# Patient Record
Sex: Female | Born: 1975 | Race: White | Hispanic: No | Marital: Married | Smoking: Never smoker
Health system: Southern US, Community
[De-identification: ages and names within clinical notes are randomized; demographics above are authoritative.]

## PROBLEM LIST (undated history)

## (undated) DIAGNOSIS — G43909 Migraine, unspecified, not intractable, without status migrainosus: Secondary | ICD-10-CM

## (undated) DIAGNOSIS — Z8742 Personal history of other diseases of the female genital tract: Secondary | ICD-10-CM

## (undated) HISTORY — DX: Personal history of other diseases of the female genital tract: Z87.42

## (undated) HISTORY — PX: AUGMENTATION MAMMAPLASTY: SUR837

## (undated) HISTORY — PX: TONSILLECTOMY: SHX5217

## (undated) HISTORY — DX: Migraine, unspecified, not intractable, without status migrainosus: G43.909

---

## 1998-11-22 ENCOUNTER — Other Ambulatory Visit: Admission: RE | Admit: 1998-11-22 | Discharge: 1998-11-22 | Payer: Self-pay | Admitting: *Deleted

## 1998-11-23 ENCOUNTER — Other Ambulatory Visit: Admission: RE | Admit: 1998-11-23 | Discharge: 1998-11-23 | Payer: Self-pay | Admitting: *Deleted

## 1999-03-29 ENCOUNTER — Other Ambulatory Visit: Admission: RE | Admit: 1999-03-29 | Discharge: 1999-03-29 | Payer: Self-pay | Admitting: *Deleted

## 1999-07-26 ENCOUNTER — Other Ambulatory Visit: Admission: RE | Admit: 1999-07-26 | Discharge: 1999-07-26 | Payer: Self-pay | Admitting: *Deleted

## 1999-11-21 ENCOUNTER — Other Ambulatory Visit: Admission: RE | Admit: 1999-11-21 | Discharge: 1999-11-21 | Payer: Self-pay | Admitting: *Deleted

## 2001-11-13 HISTORY — PX: BREAST BIOPSY: SHX20

## 2007-01-31 ENCOUNTER — Ambulatory Visit: Payer: Self-pay

## 2007-02-15 ENCOUNTER — Ambulatory Visit: Payer: Self-pay

## 2018-02-18 ENCOUNTER — Other Ambulatory Visit: Payer: Self-pay | Admitting: Certified Nurse Midwife

## 2018-02-18 DIAGNOSIS — Z1231 Encounter for screening mammogram for malignant neoplasm of breast: Secondary | ICD-10-CM

## 2018-03-07 ENCOUNTER — Other Ambulatory Visit: Payer: Self-pay | Admitting: Certified Nurse Midwife

## 2018-03-07 ENCOUNTER — Encounter: Payer: Self-pay | Admitting: Radiology

## 2018-03-07 ENCOUNTER — Ambulatory Visit
Admission: RE | Admit: 2018-03-07 | Discharge: 2018-03-07 | Disposition: A | Discharge status: Home / Self Care | Payer: Self-pay | Source: Ambulatory Visit | Attending: Certified Nurse Midwife | Admitting: Certified Nurse Midwife

## 2018-03-07 DIAGNOSIS — Z1231 Encounter for screening mammogram for malignant neoplasm of breast: Secondary | ICD-10-CM | POA: Insufficient documentation

## 2018-03-08 ENCOUNTER — Ambulatory Visit: Payer: Self-pay | Admitting: Internal Medicine

## 2018-03-08 ENCOUNTER — Encounter

## 2019-05-19 ENCOUNTER — Telehealth: Payer: Self-pay

## 2019-05-19 NOTE — Telephone Encounter (Signed)
TC from patient. IUD r/s 05/28/19 Aileen Fass, RN

## 2019-05-20 ENCOUNTER — Ambulatory Visit: Payer: Self-pay

## 2019-05-28 ENCOUNTER — Ambulatory Visit (LOCAL_COMMUNITY_HEALTH_CENTER): Payer: Self-pay

## 2019-05-28 ENCOUNTER — Other Ambulatory Visit: Payer: Self-pay

## 2019-05-28 VITALS — BP 111/75 | Ht 68.0 in | Wt 142.0 lb

## 2019-05-28 DIAGNOSIS — Z30430 Encounter for insertion of intrauterine contraceptive device: Secondary | ICD-10-CM

## 2019-05-28 DIAGNOSIS — Z3009 Encounter for other general counseling and advice on contraception: Secondary | ICD-10-CM

## 2019-05-28 MED ORDER — LEVONORGESTREL 20 MCG/24HR IU IUD
1.0000 | INTRAUTERINE_SYSTEM | Freq: Once | INTRAUTERINE | Status: AC
Start: 2019-05-28 — End: 2019-05-28
  Administered 2019-05-28: 1 via INTRAUTERINE

## 2019-05-28 NOTE — Progress Notes (Signed)
    Patient presented to [facility] for IUD insertion. Her GC/CT screening was declined by client d/t stated monogamous relationship and using WHO criteria we can be reasonably certain she is not pregnant.  See Flowsheet for IUD check list  IUD Insertion Procedure Note Patient identified, informed consent performed, consent signed.   Discussed risks of irregular bleeding, cramping, infection, malpositioning or misplacement of the IUD outside the uterus which may require further procedure such as laparoscopy. Time out was performed.  Urine pregnancy test negative.  Speculum placed in the vagina.  Cervix visualized.  Cleaned with Betadine x 2.  Cervix Grasped anteriorly with a single tooth tenaculum.  Uterus sounded to 8 cm.  IUD placed per manufacturer's recommendations.  Strings trimmed to 3 cm. Tenaculum was removed, good hemostasis noted.  Patient tolerated procedure well.   Patient was given post-procedure instructions.  She was advised to have backup contraception for one week.  Patient was also asked to check IUD strings periodically or follow up in 4 weeks for IUD check. Assessment/Plan Client declined GC/CT today citing that she and husband are monogamous. Co. To use condoms x 1 wk as back-up. Co. To take ibuprofen 6---800- mg q 6-8 hr prn Notify clinic for concerns or problems.

## 2019-09-25 ENCOUNTER — Other Ambulatory Visit: Payer: Self-pay

## 2019-09-25 ENCOUNTER — Ambulatory Visit (LOCAL_COMMUNITY_HEALTH_CENTER): Payer: Self-pay | Admitting: Advanced Practice Midwife

## 2019-09-25 VITALS — BP 128/72 | Ht 68.0 in | Wt 143.0 lb

## 2019-09-25 DIAGNOSIS — Z30432 Encounter for removal of intrauterine contraceptive device: Secondary | ICD-10-CM

## 2019-09-25 DIAGNOSIS — Z3009 Encounter for other general counseling and advice on contraception: Secondary | ICD-10-CM

## 2019-09-25 NOTE — Progress Notes (Signed)
Reports vaginal bleeding since insertion of Mirena. Husband is getting vasectomy. Last sex last night without condom. Aileen Fass, RN

## 2019-09-25 NOTE — Progress Notes (Signed)
   [facility] problem visit  [facility]  Subjective:  Cherril KAMARRI FISCHETTI is a 43 y.o.G2P2 nonsmoker MWF being seen today for Mirena removal  No chief complaint on file.   HPI Pt states Mirena inserted 05/28/19 and has had bleeding since then.  Last sex last night without condom.  Husband plans on vasectomy but no date as of yet; he has gone 3x for procedure but cancelled.  Last pap 08/2018 wnl per pt.  Counseled on normalcy of irregular bleeding/spotting for 3-5 months after insertion and the small chance she could conceive from unprotected coitus last night--pt insists on removal today, declines ECP today.  Does the patient have a current or past history of drug use? No   No components found for: HCV]   Health Maintenance Due  Topic Date Due  . HIV Screening  02/11/1989  . PAP SMEAR-Modifier  02/12/1995  . TETANUS/TDAP  05/27/2017  . INFLUENZA VACCINE  06/14/2019    ROS  The following portions of the patient's history were reviewed and updated as appropriate: allergies, current medications, past family history, past medical history, past social history, past surgical history and problem list. Problem list updated.   See flowsheet for other program required questions.  Objective:   Vitals:   09/25/19 1500  BP: 128/72  Weight: 143 lb (64.9 kg)  Height: 5\' 8"  (1.727 m)    Physical Exam Genitourinary:    General: Normal vulva.     Exam position: Lithotomy position.     Labia:        Right: No lesion.        Left: No lesion.      Vagina: Vaginal discharge (dark brown/red menses blood, ph>4.5, denies sxs vaginitis) present.     Cervix: Normal.     Uterus: Normal.      Adnexa: Right adnexa normal and left adnexa normal.     Rectum: Normal.       Assessment and Plan:  Mayo L Dyk is a 43 y.o. female presenting to the [facility] Department for a Women's Health problem visit  1. Family planning Pt counseled to do PT  10/09/19 and call if +  2. Encounter for removal of intrauterine contraceptive device (IUD)   IUD Removal  Patient identified, informed consent performed, consent signed.  Patient was in the dorsal lithotomy position, normal external genitalia was noted.  A speculum was placed in the patient's vagina, normal discharge was noted, no lesions. The cervix was visualized, no lesions, no abnormal discharge.  The strings of the IUD were grasped and pulled using ring forceps. The IUD was removed in its entirety.  Patient tolerated the procedure well.    Patient will use condoms for contraception.  Pt does not want pregnancy.  Routine preventative health maintenance measures emphasized.      Return if symptoms worsen or fail to improve.  No future appointments.  Herbie Saxon, CNM

## 2020-12-08 ENCOUNTER — Encounter: Payer: Self-pay | Admitting: Internal Medicine

## 2020-12-08 DIAGNOSIS — Z1231 Encounter for screening mammogram for malignant neoplasm of breast: Secondary | ICD-10-CM

## 2020-12-08 NOTE — Telephone Encounter (Signed)
If she is due screening and not having any problems, ok to place order.  Also, confirm we have records.  If not, will need to obtain prior to her appt.

## 2020-12-15 ENCOUNTER — Encounter: Payer: Self-pay | Admitting: Internal Medicine

## 2021-01-04 ENCOUNTER — Other Ambulatory Visit: Payer: Self-pay

## 2021-01-04 ENCOUNTER — Ambulatory Visit: Payer: Self-pay | Attending: Oncology

## 2021-01-04 ENCOUNTER — Ambulatory Visit
Admission: RE | Admit: 2021-01-04 | Discharge: 2021-01-04 | Disposition: A | Discharge status: Home / Self Care | Payer: Self-pay | Source: Ambulatory Visit | Attending: Oncology | Admitting: Oncology

## 2021-01-04 VITALS — BP 111/56 | HR 71 | Temp 98.6°F | Ht 69.0 in | Wt 142.7 lb

## 2021-01-04 DIAGNOSIS — Z0000 Encounter for general adult medical examination without abnormal findings: Secondary | ICD-10-CM | POA: Insufficient documentation

## 2021-01-04 NOTE — Progress Notes (Signed)
  Subjective:     [name], female   DOB: [birth year], 45 y.o.   MRN: [id number]  HPI   Review of Systems     Objective:   Physical Exam Chest:  Breasts:     Right: No swelling, bleeding, inverted nipple, mass, nipple discharge, skin change or tenderness.     Left: No swelling, bleeding, inverted nipple, mass, nipple discharge, skin change or tenderness.        Comments: Bilateral breast implants       Assessment:     45 year old patient presents for BCCCP clinic visitPatient screened, and meets BCCCP eligibility.  Patient does not have insurance, Medicare or Medicaid. Instructed patient on breast self awareness using teach back method.  Clinical breast exam unremarkable.  Patient has first and second degree relatives with breast and ovarian cancer.    Mother, and sister had BRCA negative testing in last 6 months.  Declines genetic counseling at this time.   Risk Assessment    Risk Scores      01/04/2021   Last edited by: Rico Junker, RN   5-year risk: 1.5 %   Lifetime risk: 13.4 %            Plan:     Sent for bilateral screening mammogram.

## 2021-01-10 NOTE — Progress Notes (Signed)
Letter mailed from [facility] to notify of normal mammogram results.  Patient to return in one year for annual screening.  Copy to [facility]. 

## 2021-02-21 ENCOUNTER — Telehealth: Payer: Self-pay

## 2021-02-21 ENCOUNTER — Encounter: Payer: Self-pay | Admitting: Internal Medicine

## 2021-02-21 ENCOUNTER — Ambulatory Visit (INDEPENDENT_AMBULATORY_CARE_PROVIDER_SITE_OTHER): Payer: Self-pay | Admitting: Internal Medicine

## 2021-02-21 ENCOUNTER — Other Ambulatory Visit: Payer: Self-pay

## 2021-02-21 DIAGNOSIS — G43809 Other migraine, not intractable, without status migrainosus: Secondary | ICD-10-CM

## 2021-02-21 DIAGNOSIS — Z1211 Encounter for screening for malignant neoplasm of colon: Secondary | ICD-10-CM

## 2021-02-21 DIAGNOSIS — Z8742 Personal history of other diseases of the female genital tract: Secondary | ICD-10-CM

## 2021-02-21 DIAGNOSIS — Z8041 Family history of malignant neoplasm of ovary: Secondary | ICD-10-CM

## 2021-02-21 MED ORDER — MAGNESIUM OXIDE 400 MG PO CAPS: ORAL_CAPSULE | ORAL | 1 refills | Status: DC

## 2021-02-21 NOTE — Telephone Encounter (Signed)
Labs printed for patient.

## 2021-02-21 NOTE — Progress Notes (Signed)
[name], female   DOB: [birth year], 45 y.o.   MRN: [id number]   Subjective:    [name], female    DOB: 1976-06-19, 45 y.o.   MRN: [id number]  HPI This visit occurred during the SARS-CoV-2 public health emergency.  Safety protocols were in place, including screening questions prior to the visit, additional usage of staff PPE, and extensive cleaning of exam room while observing appropriate contact time as indicated for disinfecting solutions.  Patient here to establish care.  Morene Rankins (mother).  She reports she is doing relatively well.  Has been healthy.  Works out 3-5x/week. No chest pain or sob reported.  No acid reflux or abdominal pain reported. Up to date with mammogram - 01/04/21.  Reports in 2019 - had abdominal and pelvic ultrasound.  History of ruptured ovarian cysts.  Bowels stable.  Has a history of migraines.  Is sensitive to light and smell.  States may have been aggravated by weather/pressure change.  Headaches - back of head and up and under left eye.  Drinks soda and takes excedrin.  Has a history of menstrual migraine.  Discussed trial of magnesium.  Had IUD 05/2019.  Out now. Intolerance.  Husband - vasectomy.  Periods relatively stable.  LMP 3/22 - some pain with period. None now.  Handling stress relatively well.  Takes hydroxyzine prn.    Past Medical History:  Diagnosis Date  . History of abnormal cervical Pap smear   . History of ovarian cyst   . Migraine    Past Surgical History:  Procedure Laterality Date  . AUGMENTATION MAMMAPLASTY Bilateral   . BREAST BIOPSY Left 2003  . TONSILLECTOMY     Family History  Problem Relation Age of Onset  . Breast cancer Maternal Grandmother 50  . Breast cancer Other 30  . Breast cancer Other   . Ovarian cancer Mother    Social History   Socioeconomic History  . Marital status: Married    Spouse name: Not on file  . Number of children: Not on file  . Years of education: Not on file  . Highest  education level: Not on file  Occupational History  . Not on file  Tobacco Use  . Smoking status: Never Smoker  . Smokeless tobacco: Never Used  Substance and Sexual Activity  . Alcohol use: Yes  . Drug use: Never  . Sexual activity: Yes  Other Topics Concern  . Not on file  Social History Narrative  . Not on file   Social Determinants of Health   Financial Resource Strain: Not on file  Food Insecurity: Not on file  Transportation Needs: Not on file  Physical Activity: Not on file  Stress: Not on file  Social Connections: Not on file    Outpatient Encounter Medications as of 02/21/2021  Medication Sig  . Magnesium Oxide 400 MG CAPS Take one capsule q day   No facility-administered encounter medications on file as of 02/21/2021.    Review of Systems  Constitutional: Negative for appetite change and unexpected weight change.  HENT: Negative for congestion and sinus pressure.   Respiratory: Negative for cough, chest tightness and shortness of breath.   Cardiovascular: Negative for chest pain, palpitations and leg swelling.  Gastrointestinal: Negative for abdominal pain, diarrhea, nausea and vomiting.  Genitourinary: Negative for difficulty urinating and dysuria.  Musculoskeletal: Negative for joint swelling and myalgias.  Skin: Negative for color change and rash.  Neurological: Positive for headaches. Negative for  dizziness and light-headedness.  Psychiatric/Behavioral: Negative for agitation and dysphoric mood.       Objective:    Physical Exam Vitals reviewed.  Constitutional:      General: She is not in acute distress.    Appearance: Normal appearance.  HENT:     Head: Normocephalic and atraumatic.     Right Ear: External ear normal.     Left Ear: External ear normal.  Eyes:     General: No scleral icterus.       Right eye: No discharge.        Left eye: No discharge.     Conjunctiva/sclera: Conjunctivae normal.  Neck:     Thyroid: No thyromegaly.   Cardiovascular:     Rate and Rhythm: Normal rate and regular rhythm.  Pulmonary:     Effort: No respiratory distress.     Breath sounds: Normal breath sounds. No wheezing.  Abdominal:     General: Bowel sounds are normal.     Palpations: Abdomen is soft.     Tenderness: There is no abdominal tenderness.  Musculoskeletal:        General: No swelling or tenderness.     Cervical back: Neck supple. No tenderness.  Lymphadenopathy:     Cervical: No cervical adenopathy.  Skin:    Findings: No erythema or rash.  Neurological:     Mental Status: She is alert.  Psychiatric:        Mood and Affect: Mood normal.        Behavior: Behavior normal.     BP 102/70   Pulse 80   Temp 98.3 F (36.8 C) (Oral)   Resp 16   Ht 5\' 9"  (1.753 m)   Wt 144 lb 9.6 oz (65.6 kg)   SpO2 99%   BMI 21.35 kg/m  Wt Readings from Last 3 Encounters:  02/21/21 144 lb 9.6 oz (65.6 kg)  01/04/21 142 lb 11.2 oz (64.7 kg)  09/25/19 143 lb (64.9 kg)    MS DIGITAL SCREENING TOMO W/IMPLANTS BILAT  Result Date: 01/07/2021 CLINICAL DATA:  Screening. Strong family history. EXAM: DIGITAL SCREENING BILATERAL MAMMOGRAM WITH IMPLANTS, CAD AND TOMOSYNTHESIS TECHNIQUE: Bilateral screening digital craniocaudal and mediolateral oblique mammograms were obtained. Bilateral screening digital breast tomosynthesis was performed. The images were evaluated with computer-aided detection. Standard and/or implant displaced views were performed. COMPARISON:  Previous exam(s). ACR Breast Density Category c: The breast tissue is heterogeneously dense, which may obscure small masses. FINDINGS: The patient has retropectoral saline implants. There are no findings suspicious for malignancy. Saline implants are intact. IMPRESSION: No mammographic evidence of malignancy. A result letter of this screening mammogram will be mailed directly to the patient. RECOMMENDATION: Screening mammogram in one year. (Code:SM-B-01Y) The American Cancer Society  recommends annual MRI and mammography in patients with an estimated lifetime risk of developing breast cancer greater than 20 - 25%, or who are known or suspected to be positive for the breast cancer gene. BI-RADS CATEGORY  1:  Negative. Electronically Signed   By: 01/09/2021 MD   On: 01/07/2021 13:10       Assessment & Plan:   Problem List Items Addressed This Visit    Colon cancer screening    Colonoscopy - recommended age 66 now.        Family history of ovarian cancer    Mother with history of ovarian cancer - undergoing treatment.  Follow.  Review family history.        History of abnormal cervical  Pap smear    Noted in history.  Recent pap ok.  Schedule for cpe.       History of ovarian cyst    Previous evaluation with ultrasound.  No acute problems currently.        Migraines    History of migraine headaches as outlined.  Discussed trial of mag oxide.  Follow.            Dale Barahona, MD

## 2021-02-23 LAB — COMPREHENSIVE METABOLIC PANEL
Albumin: 4.3 (ref 3.5–5.0)
GFR calc Af Amer: 121
GFR calc non Af Amer: 105
Globulin: 2

## 2021-02-23 LAB — LIPID PANEL
Cholesterol: 176 (ref 0–200)
HDL: 78 — AB (ref 35–70)
LDL Cholesterol: 85
Triglycerides: 58 (ref 40–160)

## 2021-02-23 LAB — BASIC METABOLIC PANEL
BUN: 14 (ref 4–21)
CO2: 24 — AB (ref 13–22)
Chloride: 105 (ref 99–108)
Creatinine: 0.7 (ref 0.5–1.1)
Glucose: 99
Potassium: 4.7 (ref 3.4–5.3)
Sodium: 139 (ref 137–147)

## 2021-02-23 LAB — HEPATIC FUNCTION PANEL
ALT: 13 (ref 7–35)
AST: 15 (ref 13–35)
Alkaline Phosphatase: 50 (ref 25–125)
Bilirubin, Total: 0.8

## 2021-02-23 LAB — CBC AND DIFFERENTIAL
HCT: 41 (ref 36–46)
Hemoglobin: 13.4 (ref 12.0–16.0)
Neutrophils Absolute: 4349
Platelets: 213 (ref 150–399)
WBC: 6.5

## 2021-02-23 LAB — HEMOGLOBIN A1C: Hemoglobin A1C: 4.8

## 2021-02-23 LAB — CBC: RBC: 4.33 (ref 3.87–5.11)

## 2021-02-23 LAB — TSH: TSH: 0.87 (ref 0.41–5.90)

## 2021-02-28 ENCOUNTER — Encounter: Payer: Self-pay | Admitting: Internal Medicine

## 2021-02-28 DIAGNOSIS — Z8742 Personal history of other diseases of the female genital tract: Secondary | ICD-10-CM | POA: Insufficient documentation

## 2021-02-28 DIAGNOSIS — G43909 Migraine, unspecified, not intractable, without status migrainosus: Secondary | ICD-10-CM | POA: Insufficient documentation

## 2021-02-28 DIAGNOSIS — Z8041 Family history of malignant neoplasm of ovary: Secondary | ICD-10-CM | POA: Insufficient documentation

## 2021-02-28 DIAGNOSIS — Z1211 Encounter for screening for malignant neoplasm of colon: Secondary | ICD-10-CM | POA: Insufficient documentation

## 2021-02-28 NOTE — Assessment & Plan Note (Signed)
Noted in history.  Recent pap ok.  Schedule for cpe.

## 2021-02-28 NOTE — Assessment & Plan Note (Signed)
Mother with history of ovarian cancer - undergoing treatment.  Follow.  Review family history.

## 2021-02-28 NOTE — Assessment & Plan Note (Signed)
Colonoscopy - recommended age 45 now.

## 2021-02-28 NOTE — Assessment & Plan Note (Signed)
History of migraine headaches as outlined.  Discussed trial of mag oxide.  Follow.

## 2021-02-28 NOTE — Assessment & Plan Note (Signed)
Previous evaluation with ultrasound.  No acute problems currently.

## 2021-03-21 ENCOUNTER — Telehealth: Payer: Self-pay | Admitting: Internal Medicine

## 2021-03-21 MED ORDER — MAGNESIUM OXIDE 400 MG PO TABS: 400.0000 mg | ORAL_TABLET | Freq: Every day | ORAL | 1 refills | Status: DC

## 2021-03-21 NOTE — Telephone Encounter (Signed)
I can send in mag oxide tablets, but need to know what pharmacy she uses locally. The only pharmacy in the system is [facility].  Thanks

## 2021-03-21 NOTE — Telephone Encounter (Signed)
Patient called in about her prescription for Magnesium Oxide 400 MG CAPS was sent to pharmacy they did not have the capsule need a new prescription for tablets

## 2021-03-21 NOTE — Telephone Encounter (Signed)
Mistake sorry

## 2021-03-21 NOTE — Telephone Encounter (Signed)
Patient uses [facility] in [place]. Medication sent in to preferred pharmacy per protocol.

## 2021-05-24 ENCOUNTER — Encounter: Payer: Self-pay | Admitting: Internal Medicine

## 2021-06-21 ENCOUNTER — Telehealth: Payer: Self-pay | Admitting: Internal Medicine

## 2021-06-21 NOTE — Telephone Encounter (Signed)
Pt said at her last appt that hydroxyzine hcl 25 mg was discussed and she would like it sent to [facility] in [place]

## 2021-06-21 NOTE — Telephone Encounter (Signed)
Per your last note in Sheri Leonard she uses hydroxyzine 25 mg prn. This has not been prescribed by you but she has only seen you one time to establish care. Are you ok to send in prescription for her?

## 2021-06-22 MED ORDER — HYDROXYZINE PAMOATE 25 MG PO CAPS: 25.0000 mg | ORAL_CAPSULE | Freq: Two times a day (BID) | ORAL | 0 refills | Status: DC | PRN

## 2021-06-22 NOTE — Addendum Note (Signed)
Addended by: Charm Barges on: 06/22/2021 02:56 PM   Modules accepted: Orders

## 2021-06-22 NOTE — Telephone Encounter (Signed)
Patient says it was originally prescribed for 1 tab TID PRN but patient 1-2 PRN and does not take 2 tablets at the same time. She has been using about 3 tablets per week for the last few weeks.

## 2021-06-22 NOTE — Telephone Encounter (Signed)
I do not mind prescribing hydroxyzine.  Per our conversation, she takes prn.   Just need to clarify a few things.  Let her know that I do not mind refilling, but just need to confirm how often she takes.

## 2021-06-22 NOTE — Telephone Encounter (Signed)
Rx sent to pharmacy for hydroxyzine #30 with no refills.

## 2021-07-19 ENCOUNTER — Other Ambulatory Visit: Payer: Self-pay

## 2021-07-19 ENCOUNTER — Encounter: Payer: Self-pay | Admitting: Internal Medicine

## 2021-07-19 ENCOUNTER — Ambulatory Visit (INDEPENDENT_AMBULATORY_CARE_PROVIDER_SITE_OTHER): Payer: Self-pay | Admitting: Internal Medicine

## 2021-07-19 VITALS — BP 118/76 | HR 67 | Temp 97.9°F | Resp 16 | Ht 69.0 in | Wt 142.0 lb

## 2021-07-19 DIAGNOSIS — G43809 Other migraine, not intractable, without status migrainosus: Secondary | ICD-10-CM

## 2021-07-19 DIAGNOSIS — Z8041 Family history of malignant neoplasm of ovary: Secondary | ICD-10-CM

## 2021-07-19 DIAGNOSIS — Z1211 Encounter for screening for malignant neoplasm of colon: Secondary | ICD-10-CM

## 2021-07-19 DIAGNOSIS — Z8742 Personal history of other diseases of the female genital tract: Secondary | ICD-10-CM

## 2021-07-19 DIAGNOSIS — Z0000 Encounter for general adult medical examination without abnormal findings: Secondary | ICD-10-CM

## 2021-07-19 DIAGNOSIS — Z124 Encounter for screening for malignant neoplasm of cervix: Secondary | ICD-10-CM

## 2021-07-19 DIAGNOSIS — N920 Excessive and frequent menstruation with regular cycle: Secondary | ICD-10-CM

## 2021-07-19 NOTE — Progress Notes (Signed)
[name], female   DOB: 16-Nov-1975, 45 y.o.   MRN: [id number]   Subjective:    [name], female    DOB: 1975-12-21, 45 y.o.   MRN: [id number]  This visit occurred during the SARS-CoV-2 public health emergency.  Safety protocols were in place, including screening questions prior to the visit, additional usage of staff PPE, and extensive cleaning of exam room while observing appropriate contact time as indicated for disinfecting solutions.   Patient here for her physical.   Chief Complaint  Patient presents with   Annual Exam   .   HPI States she is doing relatively well.  Stress better.  Is exercising.  Is frustrated about her weight.  She is exercising 3-5x/week.  Going to the gym.  Watching her diet.  Wants something to help.  Discussed BMI and weight loss medications.  No chest pain or sob reported.  Still having headaches.  Magnesium did not help.  Discussed treatment options.  No acid reflux reported.  No abdominal pain.  No history of kidney stones.  Bowels stable.  Having heavy periods - for 2-3 days - periods heavy.  Period lasts 5-6 days. (q 25 days).  Also has a history of ovarian cysts.  States had pain last month, that was similar.  No pain now, but states will flare intermittently.    Past Medical History:  Diagnosis Date   History of abnormal cervical Pap smear    History of ovarian cyst    Migraine    Past Surgical History:  Procedure Laterality Date   AUGMENTATION MAMMAPLASTY Bilateral    BREAST BIOPSY Left 2003   TONSILLECTOMY     Family History  Problem Relation Age of Onset   Breast cancer Maternal Grandmother 61   Breast cancer Other 30   Breast cancer Other    Ovarian cancer Mother    Social History   Socioeconomic History   Marital status: Married    Spouse name: Not on file   Number of children: Not on file   Years of education: Not on file   Highest education level: Not on file  Occupational History   Not on file   Tobacco Use   Smoking status: Never   Smokeless tobacco: Never  Substance and Sexual Activity   Alcohol use: Yes   Drug use: Never   Sexual activity: Yes  Other Topics Concern   Not on file  Social History Narrative   Not on file   Social Determinants of Health   Financial Resource Strain: Not on file  Food Insecurity: Not on file  Transportation Needs: Not on file  Physical Activity: Not on file  Stress: Not on file  Social Connections: Not on file     Review of Systems  Constitutional:  Negative for appetite change and unexpected weight change.  HENT:  Negative for congestion, sinus pressure and sore throat.   Eyes:  Negative for pain and visual disturbance.  Respiratory:  Negative for cough, chest tightness and shortness of breath.   Cardiovascular:  Negative for chest pain and palpitations.  Gastrointestinal:  Negative for diarrhea, nausea and vomiting.       Intermittent lower abdominal/pelvic pain - as outlined.  History of cysts.   Genitourinary:  Negative for difficulty urinating and dysuria.       Heavy menstrual cycle as outlined.    Musculoskeletal:  Negative for joint swelling and myalgias.  Skin:  Negative for color change and  rash.  Neurological:  Positive for headaches. Negative for dizziness and light-headedness.  Hematological:  Negative for adenopathy. Does not bruise/bleed easily.  Psychiatric/Behavioral:  Negative for agitation and dysphoric mood.       Objective:     BP 118/76   Pulse 67   Temp 97.9 F (36.6 C)   Resp 16   Ht 5\' 9"  (1.753 m)   Wt 142 lb (64.4 kg)   SpO2 98%   BMI 20.97 kg/m  Wt Readings from Last 3 Encounters:  07/19/21 142 lb (64.4 kg)  02/21/21 144 lb 9.6 oz (65.6 kg)  01/04/21 142 lb 11.2 oz (64.7 kg)    Physical Exam Vitals reviewed.  Constitutional:      General: She is not in acute distress.    Appearance: Normal appearance. She is well-developed.  HENT:     Head: Normocephalic and atraumatic.     Right  Ear: External ear normal.     Left Ear: External ear normal.  Eyes:     General: No scleral icterus.       Right eye: No discharge.        Left eye: No discharge.     Conjunctiva/sclera: Conjunctivae normal.  Neck:     Thyroid: No thyromegaly.  Cardiovascular:     Rate and Rhythm: Normal rate and regular rhythm.  Pulmonary:     Effort: No tachypnea, accessory muscle usage or respiratory distress.     Breath sounds: No decreased breath sounds or wheezing.     Comments: Breast implants.  Chest:  Breasts:    Right: No inverted nipple, mass, nipple discharge or tenderness (no axillary adenopathy).     Left: No inverted nipple, mass, nipple discharge or tenderness (no axilarry adenopathy).  Abdominal:     General: Bowel sounds are normal.     Palpations: Abdomen is soft.     Tenderness: There is no abdominal tenderness.  Genitourinary:    Comments: Attempted pap.  Increased blood in vaginal vault.  Unable to do pap.  Normal external genitalia.  Vaginal vault visualized without lesions.  Could not appreciate any adnexal masses or tenderness.   Musculoskeletal:        General: No swelling or tenderness.     Cervical back: Neck supple.  Lymphadenopathy:     Cervical: No cervical adenopathy.  Skin:    Findings: No erythema or rash.  Neurological:     Mental Status: She is alert and oriented to person, place, and time.  Psychiatric:        Mood and Affect: Mood normal.        Behavior: Behavior normal.     Outpatient Encounter Medications as of 07/19/2021  Medication Sig   hydrOXYzine (VISTARIL) 25 MG capsule Take 1 capsule (25 mg total) by mouth 2 (two) times daily as needed.   magnesium oxide (MAG-OX) 400 MG tablet Take 1 tablet (400 mg total) by mouth daily.   No facility-administered encounter medications on file as of 07/19/2021.     MS DIGITAL SCREENING TOMO W/IMPLANTS BILAT  Result Date: 01/07/2021 CLINICAL DATA:  Screening. Strong family history. EXAM: DIGITAL SCREENING  BILATERAL MAMMOGRAM WITH IMPLANTS, CAD AND TOMOSYNTHESIS TECHNIQUE: Bilateral screening digital craniocaudal and mediolateral oblique mammograms were obtained. Bilateral screening digital breast tomosynthesis was performed. The images were evaluated with computer-aided detection. Standard and/or implant displaced views were performed. COMPARISON:  Previous exam(s). ACR Breast Density Category c: The breast tissue is heterogeneously dense, which may obscure small masses. FINDINGS: The  patient has retropectoral saline implants. There are no findings suspicious for malignancy. Saline implants are intact. IMPRESSION: No mammographic evidence of malignancy. A result letter of this screening mammogram will be mailed directly to the patient. RECOMMENDATION: Screening mammogram in one year. (Code:SM-B-01Y) The American Cancer Society recommends annual MRI and mammography in patients with an estimated lifetime risk of developing breast cancer greater than 20 - 25%, or who are known or suspected to be positive for the breast cancer gene. BI-RADS CATEGORY  1:  Negative. Electronically Signed   By: Meda Klinefelter MD   On: 01/07/2021 13:10       Assessment & Plan:   Problem List Items Addressed This Visit     Colon cancer screening    Colonoscopy due - screening starting 43.        Family history of ovarian cancer    Mother with ovarian cancer.  Obtain pelvic ultrasound as outlined given history of cysts and intermittent pain.        Relevant Orders   US Pelvic Complete With Transvaginal   Healthcare maintenance    Physical today 07/19/21.  Unable to do pap today as outlined.  Schedule for next visit.  Mammogram 01/04/21 - [facility].  Due colonoscopy screening.       Heavy periods    Heavy periods as outlined.  Check pelvic ultrasound.  Regular periods.  Keep menstrual diary.       History of abnormal cervical Pap smear    Noted in history.  Last pap - ok.  Unable to do f/u pap today secondary to her  having her period.  Return next visit for pap smear.       History of ovarian cyst    With persistent intermittent lower pelvic pain that she relates to her history of ovarian cysts (and family history), will obtain pelvic ultrasound.        Relevant Orders   US Pelvic Complete With Transvaginal   Migraines    Persistent problems with migraines.  Magnesium did not help.  Discussed other treatment options.  Consider trial of topamax.  No history of kidney stones.  Discussed possible side effect of weight loss.  Follow.       Other Visit Diagnoses     Routine general medical examination at a health care facility    -  Primary   Cervical cancer screening            Dale Rutherford, MD

## 2021-07-20 ENCOUNTER — Telehealth: Payer: Self-pay | Admitting: Internal Medicine

## 2021-07-20 ENCOUNTER — Encounter: Payer: Self-pay | Admitting: Internal Medicine

## 2021-07-20 NOTE — Telephone Encounter (Signed)
Reviewed labs.  Cholesterol looks good.  Sugar, hgb, thyroid wnl.  Need to abstract labs.

## 2021-07-20 NOTE — Telephone Encounter (Signed)
error 

## 2021-07-23 MED ORDER — TOPIRAMATE 25 MG PO TABS
25.0000 mg | ORAL_TABLET | Freq: Every day | ORAL | 2 refills | Status: DC
Start: 2021-07-23 — End: 2022-03-10

## 2021-07-23 NOTE — Telephone Encounter (Signed)
Rx sent in for topamax.  Someone will call her with pelvic ultrasound appt.

## 2021-07-24 ENCOUNTER — Encounter: Payer: Self-pay | Admitting: Internal Medicine

## 2021-07-24 DIAGNOSIS — Z0000 Encounter for general adult medical examination without abnormal findings: Secondary | ICD-10-CM | POA: Insufficient documentation

## 2021-07-24 DIAGNOSIS — N920 Excessive and frequent menstruation with regular cycle: Secondary | ICD-10-CM | POA: Insufficient documentation

## 2021-07-24 NOTE — Assessment & Plan Note (Signed)
Persistent problems with migraines.  Magnesium did not help.  Discussed other treatment options.  Consider trial of topamax.  No history of kidney stones.  Discussed possible side effect of weight loss.  Follow.

## 2021-07-24 NOTE — Assessment & Plan Note (Signed)
With persistent intermittent lower pelvic pain that she relates to her history of ovarian cysts (and family history), will obtain pelvic ultrasound.

## 2021-07-24 NOTE — Assessment & Plan Note (Signed)
Physical today 07/19/21.  Unable to do pap today as outlined.  Schedule for next visit.  Mammogram 01/04/21 - Birads I.  Due colonoscopy screening.

## 2021-07-24 NOTE — Assessment & Plan Note (Signed)
Heavy periods as outlined.  Check pelvic ultrasound.  Regular periods.  Keep menstrual diary.

## 2021-07-24 NOTE — Assessment & Plan Note (Signed)
Noted in history.  Last pap - ok.  Unable to do f/u pap today secondary to her having her period.  Return next visit for pap smear.

## 2021-07-24 NOTE — Assessment & Plan Note (Signed)
Mother with ovarian cancer.  Obtain pelvic ultrasound as outlined given history of cysts and intermittent pain.

## 2021-07-24 NOTE — Assessment & Plan Note (Signed)
Colonoscopy due - screening starting 45.

## 2021-08-03 ENCOUNTER — Ambulatory Visit
Admission: RE | Admit: 2021-08-03 | Discharge: 2021-08-03 | Disposition: A | Discharge status: Home / Self Care | Payer: Self-pay | Source: Ambulatory Visit | Attending: Internal Medicine | Admitting: Internal Medicine

## 2021-08-03 ENCOUNTER — Other Ambulatory Visit: Payer: Self-pay

## 2021-08-03 DIAGNOSIS — Z8742 Personal history of other diseases of the female genital tract: Secondary | ICD-10-CM | POA: Insufficient documentation

## 2021-08-03 DIAGNOSIS — Z8041 Family history of malignant neoplasm of ovary: Secondary | ICD-10-CM | POA: Insufficient documentation

## 2021-08-04 ENCOUNTER — Other Ambulatory Visit: Payer: Self-pay | Admitting: Internal Medicine

## 2021-08-04 ENCOUNTER — Telehealth: Payer: Self-pay | Admitting: *Deleted

## 2021-08-04 DIAGNOSIS — Z8041 Family history of malignant neoplasm of ovary: Secondary | ICD-10-CM

## 2021-08-04 DIAGNOSIS — N83299 Other ovarian cyst, unspecified side: Secondary | ICD-10-CM

## 2021-08-04 NOTE — Progress Notes (Signed)
Order placed for gyn referral.  

## 2021-08-04 NOTE — Telephone Encounter (Signed)
-----   Message from Dale Anthony, MD sent at 08/04/2021  4:22 AM EDT ----- Tried to call pt.  Left message on machine to call the office.  Please notify her that her pelvic ultrasound reveals what appears to be a left ovarian cyst.  Radiology had recommended f/u ultrasound in 6-12 weeks.  I would like to refer her to gyn for further evaluation and f/u ultrasound.  If agreeable, need to know which gyn she prefers to see.

## 2021-08-04 NOTE — Telephone Encounter (Signed)
Left message to call office

## 2021-08-08 NOTE — Telephone Encounter (Signed)
See Imaging note.

## 2021-08-30 ENCOUNTER — Other Ambulatory Visit (HOSPITAL_COMMUNITY)
Admission: RE | Admit: 2021-08-30 | Discharge: 2021-08-30 | Disposition: A | Discharge status: Home / Self Care | Payer: Self-pay | Source: Ambulatory Visit | Attending: Internal Medicine | Admitting: Internal Medicine

## 2021-08-30 ENCOUNTER — Ambulatory Visit (INDEPENDENT_AMBULATORY_CARE_PROVIDER_SITE_OTHER): Payer: Self-pay | Admitting: Internal Medicine

## 2021-08-30 ENCOUNTER — Encounter: Payer: Self-pay | Admitting: Internal Medicine

## 2021-08-30 ENCOUNTER — Other Ambulatory Visit: Payer: Self-pay

## 2021-08-30 DIAGNOSIS — Z8041 Family history of malignant neoplasm of ovary: Secondary | ICD-10-CM

## 2021-08-30 DIAGNOSIS — Z124 Encounter for screening for malignant neoplasm of cervix: Secondary | ICD-10-CM

## 2021-08-30 DIAGNOSIS — Z1211 Encounter for screening for malignant neoplasm of colon: Secondary | ICD-10-CM

## 2021-08-30 DIAGNOSIS — G43809 Other migraine, not intractable, without status migrainosus: Secondary | ICD-10-CM

## 2021-08-30 DIAGNOSIS — Z8742 Personal history of other diseases of the female genital tract: Secondary | ICD-10-CM

## 2021-08-30 NOTE — Progress Notes (Signed)
[name], female   DOB: 03-23-1976, 45 y.o.   MRN: [id number]   Subjective:    [name], female    DOB: 1976/01/09, 45 y.o.   MRN: [id number]  This visit occurred during the SARS-CoV-2 public health emergency.  Safety protocols were in place, including screening questions prior to the visit, additional usage of staff PPE, and extensive cleaning of exam room while observing appropriate contact time as indicated for disinfecting solutions.   Patient here for a scheduled follow up.  Chief Complaint  Patient presents with   Follow-up   Gynecologic Exam   .   HPI Here to follow up regarding her headaches.  Also due a pap smear.  Started on topamax last visit.  Headaches are better.  Stays active.  No chest pain or sob reported.  No abdominal pain.  Bowels moving.  Discussed due colonoscopy.  Also, has noticed - intercourse is painful.  Had recent ultrasound - revealed complicated cyst - left ovary.  Discussed gyn f/u.     Past Medical History:  Diagnosis Date   History of abnormal cervical Pap smear    History of ovarian cyst    Migraine    Past Surgical History:  Procedure Laterality Date   AUGMENTATION MAMMAPLASTY Bilateral    BREAST BIOPSY Left 2003   TONSILLECTOMY     Family History  Problem Relation Age of Onset   Breast cancer Maternal Grandmother 34   Breast cancer Other 30   Breast cancer Other    Ovarian cancer Mother    Social History   Socioeconomic History   Marital status: Married    Spouse name: Not on file   Number of children: Not on file   Years of education: Not on file   Highest education level: Not on file  Occupational History   Not on file  Tobacco Use   Smoking status: Never   Smokeless tobacco: Never  Substance and Sexual Activity   Alcohol use: Yes   Drug use: Never   Sexual activity: Yes  Other Topics Concern   Not on file  Social History Narrative   Not on file   Social Determinants of Health    Financial Resource Strain: Not on file  Food Insecurity: Not on file  Transportation Needs: Not on file  Physical Activity: Not on file  Stress: Not on file  Social Connections: Not on file     Review of Systems  Constitutional:  Negative for appetite change and unexpected weight change.  HENT:  Negative for congestion and sinus pressure.   Respiratory:  Negative for cough, chest tightness and shortness of breath.   Cardiovascular:  Negative for chest pain, palpitations and leg swelling.  Gastrointestinal:  Negative for abdominal pain, diarrhea, nausea and vomiting.  Genitourinary:  Negative for difficulty urinating and dysuria.  Musculoskeletal:  Negative for joint swelling and myalgias.  Skin:  Negative for color change and rash.  Neurological:  Negative for dizziness and light-headedness.       Headaches improved.    Psychiatric/Behavioral:  Negative for agitation and dysphoric mood.       Objective:     BP 118/72   Pulse 100   Temp (!) 96.2 F (35.7 C)   Ht 5' 9.02" (1.753 m)   Wt 144 lb (65.3 kg)   SpO2 99%   BMI 21.26 kg/m  Wt Readings from Last 3 Encounters:  08/30/21 144 lb (65.3 kg)  07/19/21 142 lb (  64.4 kg)  02/21/21 144 lb 9.6 oz (65.6 kg)    Physical Exam Vitals reviewed.  Constitutional:      General: She is not in acute distress.    Appearance: Normal appearance.  HENT:     Head: Normocephalic and atraumatic.     Right Ear: External ear normal.     Left Ear: External ear normal.  Eyes:     General: No scleral icterus.       Right eye: No discharge.        Left eye: No discharge.     Conjunctiva/sclera: Conjunctivae normal.  Neck:     Thyroid: No thyromegaly.  Cardiovascular:     Rate and Rhythm: Normal rate and regular rhythm.  Pulmonary:     Effort: No respiratory distress.     Breath sounds: Normal breath sounds. No wheezing.  Abdominal:     General: Bowel sounds are normal.     Palpations: Abdomen is soft.     Tenderness: There is  no abdominal tenderness.  Genitourinary:    Comments: Normal external genitalia.  Vaginal vault without lesions.  Cervix identified.  Pap smear performed.  Could not appreciate any adnexal masses or tenderness.   Musculoskeletal:        General: No swelling or tenderness.     Cervical back: Neck supple. No tenderness.  Lymphadenopathy:     Cervical: No cervical adenopathy.  Skin:    Findings: No erythema or rash.  Neurological:     Mental Status: She is alert.  Psychiatric:        Mood and Affect: Mood normal.        Behavior: Behavior normal.     Outpatient Encounter Medications as of 08/30/2021  Medication Sig   hydrOXYzine (VISTARIL) 25 MG capsule Take 1 capsule (25 mg total) by mouth 2 (two) times daily as needed.   magnesium oxide (MAG-OX) 400 MG tablet Take 1 tablet (400 mg total) by mouth daily.   topiramate (TOPAMAX) 25 MG tablet Take 1 tablet (25 mg total) by mouth daily.   No facility-administered encounter medications on file as of 08/30/2021.     Lab Results  Component Value Date   WBC 6.5 02/23/2021   HGB 13.4 02/23/2021   HCT 41 02/23/2021   PLT 213 02/23/2021   CHOL 176 02/23/2021   TRIG 58 02/23/2021   HDL 78 (A) 02/23/2021   LDLCALC 85 02/23/2021   ALT 13 02/23/2021   AST 15 02/23/2021   NA 139 02/23/2021   K 4.7 02/23/2021   CL 105 02/23/2021   CREATININE 0.7 02/23/2021   BUN 14 02/23/2021   CO2 24 (A) 02/23/2021   TSH 0.87 02/23/2021   HGBA1C 4.8 02/23/2021    US Pelvic Complete With Transvaginal  Result Date: 08/03/2021 CLINICAL DATA:  Pelvic pain, history of ovarian cyst, LMP 07/19/2021 EXAM: TRANSABDOMINAL AND TRANSVAGINAL ULTRASOUND OF PELVIS TECHNIQUE: Both transabdominal and transvaginal ultrasound examinations of the pelvis were performed. Transabdominal technique was performed for global imaging of the pelvis including uterus, ovaries, adnexal regions, and pelvic cul-de-sac. It was necessary to proceed with endovaginal exam following the  transabdominal exam to visualize the RIGHT ovary, lower uterus and cervix. COMPARISON:  None FINDINGS: Uterus Measurements: 9.1 x 4.4 x 4.8 cm = volume: 102 mL. Anteverted. Normal morphology without mass Endometrium Thickness: 11 mm.  No endometrial fluid or focal abnormality Right ovary Measurements: 2.6 x 1.6 x 1.8 cm = volume: 3.9 mL. Normal morphology without mass Left ovary  Measurements: 4.1 x 3.4 x 3.6 cm = volume: 26 mL. Complicated cyst within LEFT ovary 3.9 x 2.9 x 3.0 cm, containing scattered low level internal echoes, question hemorrhagic cyst, less likely endometrioma. Other findings No free pelvic fluid.  No adnexal masses. IMPRESSION: Unremarkable uterus, endometrial complex and RIGHT ovary. Complicated cyst LEFT ovary 3.9 cm diameter, favor hemorrhagic cyst as above; follow-up ultrasound recommended in 6-12 weeks to reassess. Electronically Signed   By: Ulyses Southward M.D.   On: 08/03/2021 16:56       Assessment & Plan:   Problem List Items Addressed This Visit   None    Dale [place], MD

## 2021-08-31 LAB — CYTOLOGY - PAP
Comment: NEGATIVE
Diagnosis: NEGATIVE
High risk HPV: NEGATIVE

## 2021-09-11 ENCOUNTER — Encounter: Payer: Self-pay | Admitting: Internal Medicine

## 2021-09-11 NOTE — Assessment & Plan Note (Addendum)
Headaches improved on topamax.  Tolerating.  Continue.  Follow.

## 2021-09-11 NOTE — Assessment & Plan Note (Signed)
Last pap ok.  Repeat pap ok.

## 2021-09-11 NOTE — Assessment & Plan Note (Signed)
Pelvic ultrasound - as outlined.  Cyst present.  Refer to gyn for evaluation.

## 2021-09-11 NOTE — Assessment & Plan Note (Signed)
Colonoscopy due - screening starting 45.

## 2021-09-11 NOTE — Assessment & Plan Note (Signed)
Mother with ovarian cancer.  Pelvic ultrasound as outlined.  Refer to gyn.

## 2021-09-13 ENCOUNTER — Telehealth: Payer: Self-pay | Admitting: Internal Medicine

## 2021-09-13 NOTE — Telephone Encounter (Signed)
Rejection Reason - Duplicate Referral" Particia Jasper said on Sep 12, 2021 4:53 PM  Msg from [facility] ob/gyn  A referral was sent after this one on 09/11/2021.

## 2021-09-23 ENCOUNTER — Telehealth: Payer: Self-pay | Admitting: Internal Medicine

## 2021-09-23 NOTE — Telephone Encounter (Signed)
Rejection Reason - Other - Patient wanted to schedule after looking at her schedule." Richarda Blade said on Sep 22, 2021 11:17 AM  "Called patient to be scheduled with MD. Patient has travel coming up and will call back to schedule. " Richarda Blade said on Sep 22, 2021 11:14 AM  Msg from [facility] ob/gyn

## 2021-12-01 ENCOUNTER — Ambulatory Visit: Payer: Self-pay | Admitting: Internal Medicine

## 2022-03-05 ENCOUNTER — Encounter: Payer: Self-pay | Admitting: Internal Medicine

## 2022-03-09 NOTE — Telephone Encounter (Signed)
Printed and filed for appt ?

## 2022-03-09 NOTE — Telephone Encounter (Signed)
Please print out attachments to have for her appt tomorrow.   ?

## 2022-03-10 ENCOUNTER — Encounter: Payer: Self-pay | Admitting: Internal Medicine

## 2022-03-10 ENCOUNTER — Telehealth: Payer: Self-pay | Admitting: Internal Medicine

## 2022-03-10 ENCOUNTER — Ambulatory Visit (INDEPENDENT_AMBULATORY_CARE_PROVIDER_SITE_OTHER): Payer: Self-pay | Admitting: Internal Medicine

## 2022-03-10 ENCOUNTER — Other Ambulatory Visit: Payer: Self-pay

## 2022-03-10 DIAGNOSIS — F439 Reaction to severe stress, unspecified: Secondary | ICD-10-CM

## 2022-03-10 DIAGNOSIS — G479 Sleep disorder, unspecified: Secondary | ICD-10-CM

## 2022-03-10 MED ORDER — SERTRALINE HCL 25 MG PO TABS: 25.0000 mg | ORAL_TABLET | Freq: Every day | ORAL | 2 refills | Status: DC

## 2022-03-10 NOTE — Telephone Encounter (Signed)
Pt called stating the medication was not at pharmacy-zoloft ?

## 2022-03-10 NOTE — Progress Notes (Signed)
Patient ID: Renley Karel Jarvis, female   DOB: 1976/11/08, 46 y.o.   MRN: [id number] ? ? ?Subjective:  ? ? Patient ID: Jonessa Karel Jarvis, female    DOB: 08-18-1976, 46 y.o.   MRN: [id number] ? ? ?Patient here for work in appt.  ? ?Chief Complaint  ?Patient presents with  ? Follow-up  ?  F/u to discuss labs  ? .  ? ?HPI ?Work in to discuss recent labs and concerns regarding increased stress, fatigue.  Discussed recent labs.  Had labs through [facility].  Had questions about hormone testing and specifically about cortisol levels.  AM cortisol level was a little elevated.  She is not sleeping well.  Is exercising regularly.  Watching her diet.  Is concerned regarding not being able to lose weight.  Concerned regarding extra weight around her abdomen.  No chest pain or sob with increased activity.  No abdominal pain.  Bowels stable.  Feels fatigued.   ? ? ?Past Medical History:  ?Diagnosis Date  ? History of abnormal cervical Pap smear   ? History of ovarian cyst   ? Migraine   ? ?Past Surgical History:  ?Procedure Laterality Date  ? AUGMENTATION MAMMAPLASTY Bilateral   ? BREAST BIOPSY Left 2003  ? TONSILLECTOMY    ? ?Family History  ?Problem Relation Age of Onset  ? Breast cancer Maternal Grandmother 55  ? Breast cancer Other 30  ? Breast cancer Other   ? Ovarian cancer Mother   ? ?Social History  ? ?Socioeconomic History  ? Marital status: Married  ?  Spouse name: Not on file  ? Number of children: Not on file  ? Years of education: Not on file  ? Highest education level: Not on file  ?Occupational History  ? Not on file  ?Tobacco Use  ? Smoking status: Never  ? Smokeless tobacco: Never  ?Substance and Sexual Activity  ? Alcohol use: Yes  ? Drug use: Never  ? Sexual activity: Yes  ?Other Topics Concern  ? Not on file  ?Social History Narrative  ? Not on file  ? ?Social Determinants of Health  ? ?Financial Resource Strain: Not on file  ?Food Insecurity: Not on file  ?Transportation Needs: Not on file  ?Physical Activity: Not on  file  ?Stress: Not on file  ?Social Connections: Not on file  ? ? ? ?Review of Systems  ?Constitutional:  Positive for fatigue. Negative for appetite change.  ?HENT:  Negative for congestion and sinus pressure.   ?Respiratory:  Negative for cough, chest tightness and shortness of breath.   ?Cardiovascular:  Negative for chest pain, palpitations and leg swelling.  ?Gastrointestinal:  Negative for abdominal pain, diarrhea, nausea and vomiting.  ?Genitourinary:  Negative for difficulty urinating and dysuria.  ?Musculoskeletal:  Negative for joint swelling and myalgias.  ?Skin:  Negative for color change and rash.  ?Neurological:  Negative for dizziness, light-headedness and headaches.  ?Psychiatric/Behavioral:  Negative for agitation and dysphoric mood.   ? ?   ?Objective:  ?  ? ?BP 124/70 (BP Location: Left Arm, Patient Position: Sitting, Cuff Size: Small)   Pulse 75   Temp 98 ?F (36.7 ?C) (Temporal)   Resp 14   Ht 5\' 9"  (1.753 m)   Wt 143 lb 9.6 oz (65.1 kg)   SpO2 99%   BMI 21.21 kg/m?  ?Wt Readings from Last 3 Encounters:  ?03/10/22 143 lb 9.6 oz (65.1 kg)  ?08/30/21 144 lb (65.3 kg)  ?07/19/21 142 lb (64.4 kg)  ? ? ?  Physical Exam ?Vitals reviewed.  ?Constitutional:   ?   General: She is not in acute distress. ?   Appearance: Normal appearance.  ?HENT:  ?   Head: Normocephalic and atraumatic.  ?   Right Ear: External ear normal.  ?   Left Ear: External ear normal.  ?Eyes:  ?   General: No scleral icterus.    ?   Right eye: No discharge.     ?   Left eye: No discharge.  ?   Conjunctiva/sclera: Conjunctivae normal.  ?Neck:  ?   Thyroid: No thyromegaly.  ?Cardiovascular:  ?   Rate and Rhythm: Normal rate and regular rhythm.  ?Pulmonary:  ?   Effort: No respiratory distress.  ?   Breath sounds: Normal breath sounds. No wheezing.  ?Abdominal:  ?   General: Bowel sounds are normal.  ?   Palpations: Abdomen is soft.  ?   Tenderness: There is no abdominal tenderness.  ?Musculoskeletal:     ?   General: No swelling  or tenderness.  ?   Cervical back: Neck supple. No tenderness.  ?Lymphadenopathy:  ?   Cervical: No cervical adenopathy.  ?Skin: ?   Findings: No erythema or rash.  ?Neurological:  ?   Mental Status: She is alert.  ?Psychiatric:     ?   Mood and Affect: Mood normal.     ?   Behavior: Behavior normal.  ? ? ? ?Outpatient Encounter Medications as of 03/10/2022  ?Medication Sig  ? hydrOXYzine (VISTARIL) 25 MG capsule Take 1 capsule (25 mg total) by mouth 2 (two) times daily as needed.  ? magnesium oxide (MAG-OX) 400 MG tablet Take 1 tablet (400 mg total) by mouth daily.  ? [DISCONTINUED] sertraline (ZOLOFT) 25 MG tablet Take 1 tablet (25 mg total) by mouth daily.  ? [DISCONTINUED] topiramate (TOPAMAX) 25 MG tablet Take 1 tablet (25 mg total) by mouth daily.  ? ?No facility-administered encounter medications on file as of 03/10/2022.  ?  ? ?Lab Results  ?Component Value Date  ? WBC 6.5 02/23/2021  ? HGB 13.4 02/23/2021  ? HCT 41 02/23/2021  ? PLT 213 02/23/2021  ? CHOL 176 02/23/2021  ? TRIG 58 02/23/2021  ? HDL 78 (A) 02/23/2021  ? [place] 85 02/23/2021  ? ALT 13 02/23/2021  ? AST 15 02/23/2021  ? NA 139 02/23/2021  ? K 4.7 02/23/2021  ? CL 105 02/23/2021  ? CREATININE 0.7 02/23/2021  ? BUN 14 02/23/2021  ? CO2 24 (A) 02/23/2021  ? TSH 0.87 02/23/2021  ? HGBA1C 4.8 02/23/2021  ? ? ?   ?Assessment & Plan:  ? ?Problem List Items Addressed This Visit   ? ? Sleeping difficulty  ?  She is exercising regularly.  Increased stress.  Feels fatigued. Watching her diet.  Discussed treatment options.  Start zoloft.  See if can control some of the stress/anxiety and increased thoughts.  Follow.  ? ?  ?  ? Stress  ?  Increased stress as outlined.  Discussed today.  Concerned regarding her weight and recent labs, specifically elevated am cortisol.  Discussed stress. Discussed sleep.  She is exercising regularly.  Watching her diet.  Feels fatigued.  Discussed importance of treating stress/anxiety and working on sleep first.  Discussed  treatment options.  Start zoloft 25mg  q day with plans to titrate up.  Follow closely.  Discuss referral to psychiatry/counseling.   ? ?  ?  ? ? ?I spent 30 minutes with the patient. Time  spent discussing her current concerns and symptoms.  Specifically time spent discussing her recent labs, increased stress, sleep issues and concerns regarding not being able to lose weight.  Time also spent discussing further w/up, evaluation and treatment.  ? ?Einar Pheasant, MD  ?

## 2022-03-10 NOTE — Telephone Encounter (Signed)
See phone message 03/10/2022 ?

## 2022-03-10 NOTE — Telephone Encounter (Signed)
I called and let patient know that I did resend & looked like it went on our end. She said that [facility] system had issues & that she was now on her way to [place]. I advised that she may be able to transfer to a [facility] there if needed since not controlled since she would be there a week.  This is a new medication so she was not sure that she would even both to do this.  ?

## 2022-03-20 ENCOUNTER — Encounter: Payer: Self-pay | Admitting: Internal Medicine

## 2022-03-20 DIAGNOSIS — G479 Sleep disorder, unspecified: Secondary | ICD-10-CM | POA: Insufficient documentation

## 2022-03-20 DIAGNOSIS — F439 Reaction to severe stress, unspecified: Secondary | ICD-10-CM | POA: Insufficient documentation

## 2022-03-20 IMAGING — MG DIGITAL SCREENING BREAST BILAT IMPLANT W/ TOMO W/ CAD
8 of 16 series · 8 of 40 positions shown · non-contrast
Comparison: Previous exam(s).

CLINICAL DATA: Screening. Strong family history.

EXAM:
DIGITAL SCREENING BILATERAL MAMMOGRAM WITH IMPLANTS, CAD AND
TOMOSYNTHESIS
TECHNIQUE: Bilateral screening digital craniocaudal and mediolateral oblique
mammograms were obtained. Bilateral screening digital breast
tomosynthesis was performed. The images were evaluated with
computer-aided detection. Standard and/or implant displaced views
were performed.

[L CC]
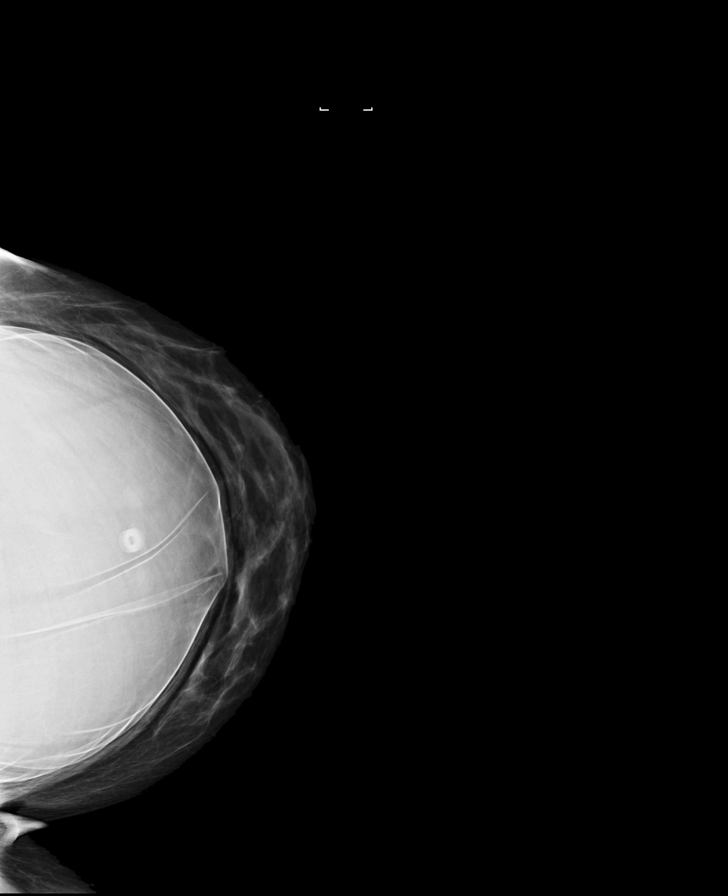

[R MLO]
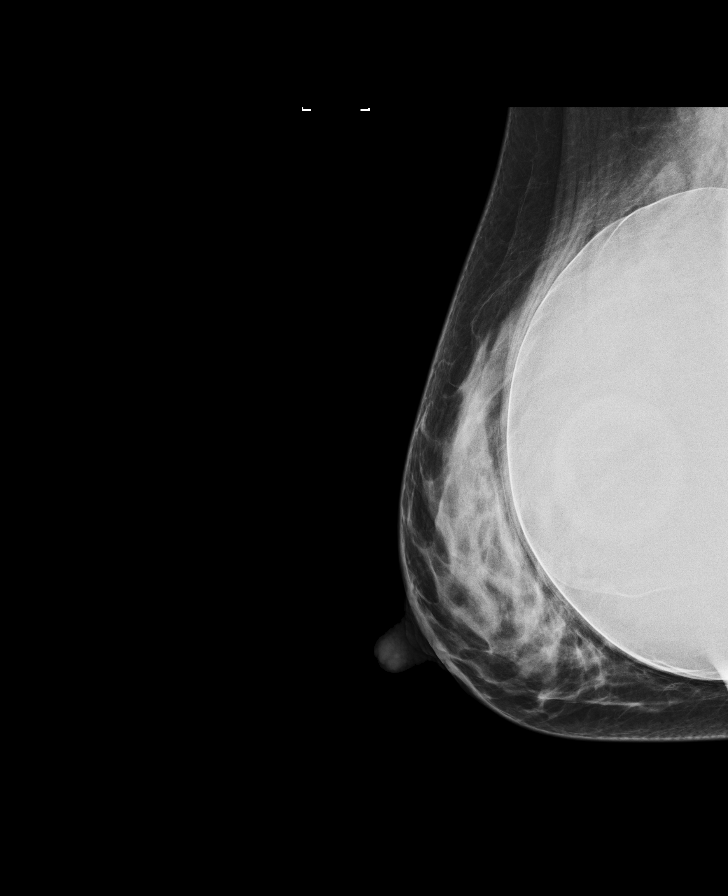

[R CC]
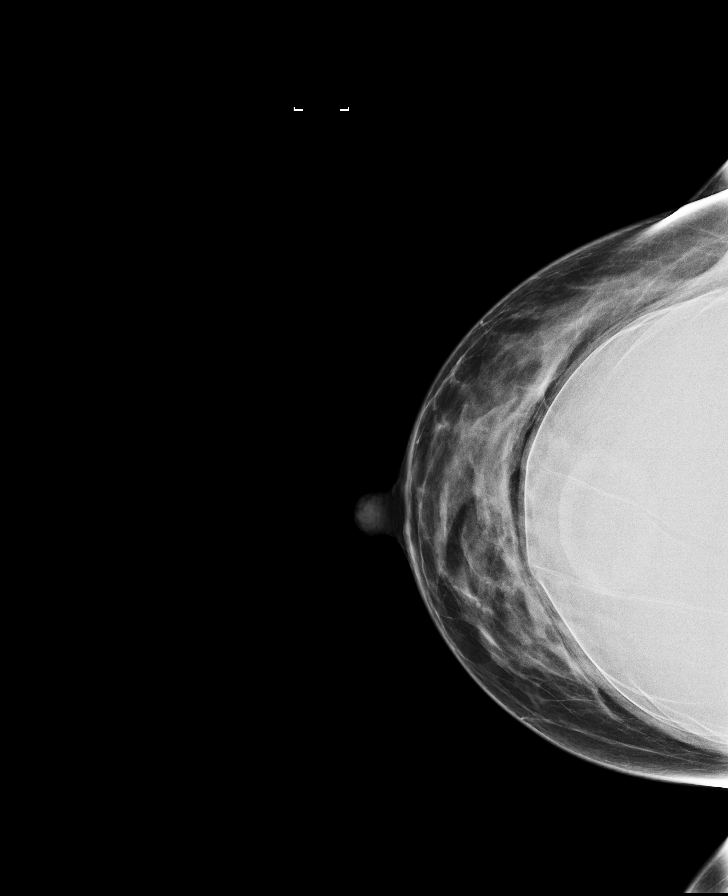

[L MLO]
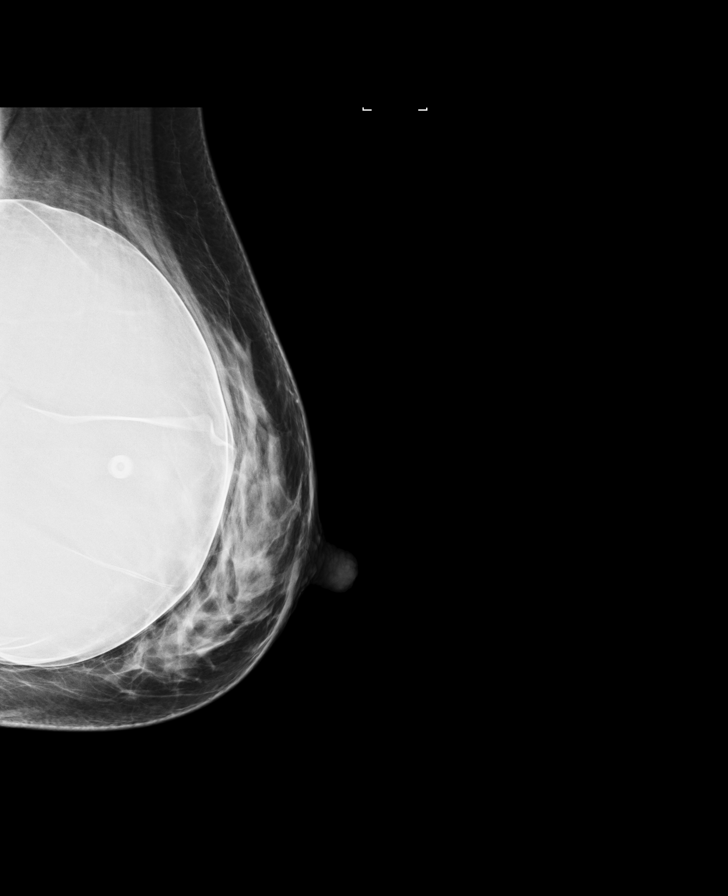

[L MLO synth-2D]
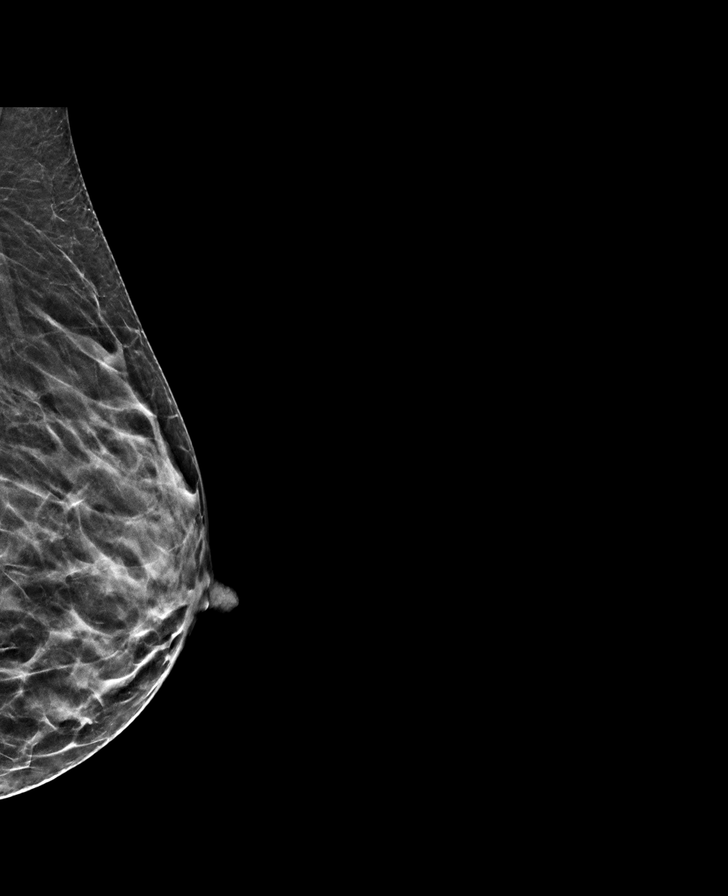

[R MLO synth-2D]
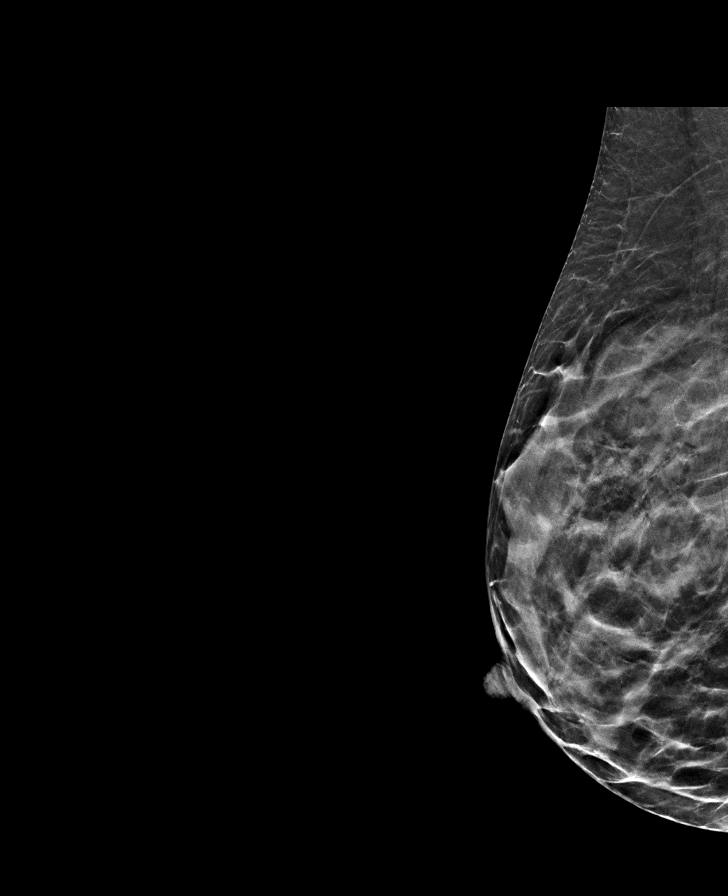

[L CC synth-2D]
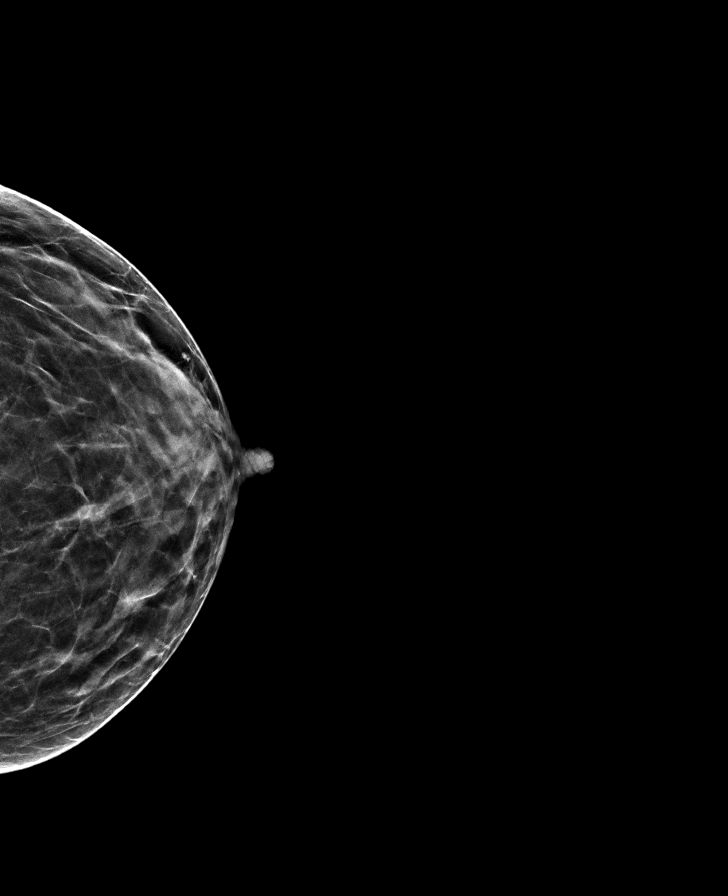

[R CC synth-2D]
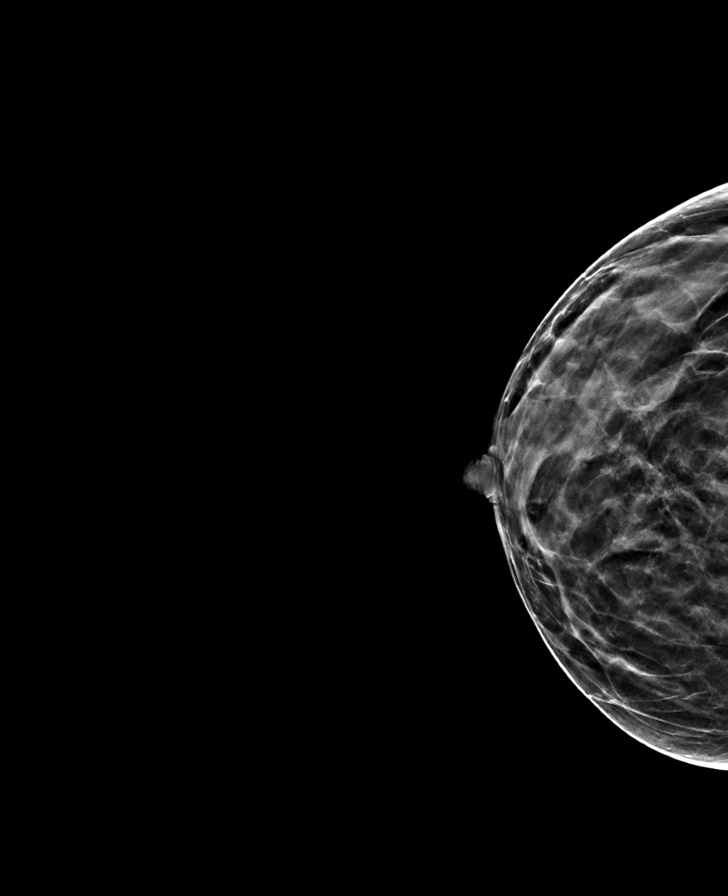

[8 of 40 positions shown; findings below may reference images not displayed]

ACR Breast Density Category c: The breast tissue is heterogeneously
dense, which may obscure small masses.
FINDINGS: The patient has retropectoral saline implants. There are no findings
suspicious for malignancy. Saline implants are intact.
IMPRESSION: No mammographic evidence of malignancy. A result letter of this
screening mammogram will be mailed directly to the patient.

RECOMMENDATION:
Screening mammogram in one year. (Code:V7-B-I34)

The American Cancer Society recommends annual MRI and mammography in
patients with an estimated lifetime risk of developing breast cancer
greater than 20 - 25%, or who are known or suspected to be positive
for the breast cancer gene.

BI-RADS CATEGORY  1:  Negative.

## 2022-03-20 NOTE — Assessment & Plan Note (Signed)
Increased stress as outlined.  Discussed today.  Concerned regarding her weight and recent labs, specifically elevated am cortisol.  Discussed stress. Discussed sleep.  She is exercising regularly.  Watching her diet.  Feels fatigued.  Discussed importance of treating stress/anxiety and working on sleep first.  Discussed treatment options.  Start zoloft 25mg  q day with plans to titrate up.  Follow closely.  Discuss referral to psychiatry/counseling.   ?

## 2022-03-20 NOTE — Assessment & Plan Note (Signed)
She is exercising regularly.  Increased stress.  Feels fatigued. Watching her diet.  Discussed treatment options.  Start zoloft.  See if can control some of the stress/anxiety and increased thoughts.  Follow.  ?

## 2022-05-03 ENCOUNTER — Ambulatory Visit: Payer: Self-pay | Admitting: Internal Medicine

## 2022-06-01 ENCOUNTER — Ambulatory Visit (INDEPENDENT_AMBULATORY_CARE_PROVIDER_SITE_OTHER): Payer: Self-pay | Admitting: Internal Medicine

## 2022-06-01 ENCOUNTER — Encounter: Payer: Self-pay | Admitting: Internal Medicine

## 2022-06-01 DIAGNOSIS — G479 Sleep disorder, unspecified: Secondary | ICD-10-CM

## 2022-06-01 DIAGNOSIS — Z8742 Personal history of other diseases of the female genital tract: Secondary | ICD-10-CM

## 2022-06-01 DIAGNOSIS — F439 Reaction to severe stress, unspecified: Secondary | ICD-10-CM

## 2022-06-01 MED ORDER — SERTRALINE HCL 50 MG PO TABS: 50.0000 mg | ORAL_TABLET | Freq: Every day | ORAL | 3 refills | Status: DC

## 2022-06-01 MED ORDER — HYDROXYZINE PAMOATE 25 MG PO CAPS
25.0000 mg | ORAL_CAPSULE | Freq: Two times a day (BID) | ORAL | 0 refills | Status: AC | PRN
Start: 2022-06-01 — End: ?

## 2022-06-01 NOTE — Progress Notes (Signed)
[name], female   DOB: 1975/11/24, 46 y.o.   MRN: [id number]   Subjective:    [name], female    DOB: July 24, 1976, 46 y.o.   MRN: [id number]   Patient here for a scheduled follow up.   Chief Complaint  Patient presents with   Stress   .   HPI Last visit, was started on zoloft for increased stress.  Is doing much better.  Feels better.  Still has some days where if things are out of routine, she can notice increased symptoms - stress, but overall has improved.  Discussed increasing dose.  Stays active.  No chest pain or sob reported.  No abdominal pain.  Bowels moving.     Past Medical History:  Diagnosis Date   History of abnormal cervical Pap smear    History of ovarian cyst    Migraine    Past Surgical History:  Procedure Laterality Date   AUGMENTATION MAMMAPLASTY Bilateral    BREAST BIOPSY Left 2003   TONSILLECTOMY     Family History  Problem Relation Age of Onset   Breast cancer Maternal Grandmother 69   Breast cancer Other 30   Breast cancer Other    Ovarian cancer Mother    Social History   Socioeconomic History   Marital status: Married    Spouse name: Not on file   Number of children: Not on file   Years of education: Not on file   Highest education level: Not on file  Occupational History   Not on file  Tobacco Use   Smoking status: Never   Smokeless tobacco: Never  Substance and Sexual Activity   Alcohol use: Yes   Drug use: Never   Sexual activity: Yes  Other Topics Concern   Not on file  Social History Narrative   Not on file   Social Determinants of Health   Financial Resource Strain: Not on file  Food Insecurity: Not on file  Transportation Needs: Not on file  Physical Activity: Not on file  Stress: Not on file  Social Connections: Not on file     Review of Systems  Constitutional:  Negative for appetite change, fatigue and unexpected weight change.  HENT:  Negative for congestion and sinus pressure.    Respiratory:  Negative for cough, chest tightness and shortness of breath.   Cardiovascular:  Negative for chest pain, palpitations and leg swelling.  Gastrointestinal:  Negative for abdominal pain, diarrhea, nausea and vomiting.  Genitourinary:  Negative for difficulty urinating and dysuria.  Musculoskeletal:  Negative for joint swelling and myalgias.  Skin:  Negative for color change and rash.  Neurological:  Negative for dizziness, light-headedness and headaches.  Psychiatric/Behavioral:  Negative for agitation and dysphoric mood.        Objective:     BP 134/68 (BP Location: Left Arm, Patient Position: Sitting, Cuff Size: Small)   Pulse 75   Temp 98.5 F (36.9 C) (Temporal)   Resp 16   Ht 5\' 9"  (1.753 m)   Wt 144 lb 12.8 oz (65.7 kg)   SpO2 98%   BMI 21.38 kg/m  Wt Readings from Last 3 Encounters:  06/01/22 144 lb 12.8 oz (65.7 kg)  03/10/22 143 lb 9.6 oz (65.1 kg)  08/30/21 144 lb (65.3 kg)    Physical Exam Vitals reviewed.  Constitutional:      General: She is not in acute distress.    Appearance: Normal appearance.  HENT:  Head: Normocephalic and atraumatic.     Right Ear: External ear normal.     Left Ear: External ear normal.  Eyes:     General: No scleral icterus.       Right eye: No discharge.        Left eye: No discharge.     Conjunctiva/sclera: Conjunctivae normal.  Neck:     Thyroid: No thyromegaly.  Cardiovascular:     Rate and Rhythm: Normal rate and regular rhythm.  Pulmonary:     Effort: No respiratory distress.     Breath sounds: Normal breath sounds. No wheezing.  Abdominal:     General: Bowel sounds are normal.     Palpations: Abdomen is soft.     Tenderness: There is no abdominal tenderness.  Musculoskeletal:        General: No swelling or tenderness.     Cervical back: Neck supple. No tenderness.  Lymphadenopathy:     Cervical: No cervical adenopathy.  Skin:    Findings: No erythema or rash.  Neurological:     Mental Status:  She is alert.  Psychiatric:        Mood and Affect: Mood normal.        Behavior: Behavior normal.      Outpatient Encounter Medications as of 06/01/2022  Medication Sig   magnesium oxide (MAG-OX) 400 MG tablet Take 1 tablet (400 mg total) by mouth daily.   sertraline (ZOLOFT) 50 MG tablet Take 1 tablet (50 mg total) by mouth daily.   [DISCONTINUED] hydrOXYzine (VISTARIL) 25 MG capsule Take 1 capsule (25 mg total) by mouth 2 (two) times daily as needed.   [DISCONTINUED] sertraline (ZOLOFT) 25 MG tablet Take 1 tablet (25 mg total) by mouth daily.   hydrOXYzine (VISTARIL) 25 MG capsule Take 1 capsule (25 mg total) by mouth 2 (two) times daily as needed.   No facility-administered encounter medications on file as of 06/01/2022.     Lab Results  Component Value Date   WBC 6.5 02/23/2021   HGB 13.4 02/23/2021   HCT 41 02/23/2021   PLT 213 02/23/2021   CHOL 176 02/23/2021   TRIG 58 02/23/2021   HDL 78 (A) 02/23/2021   LDLCALC 85 02/23/2021   ALT 13 02/23/2021   AST 15 02/23/2021   NA 139 02/23/2021   K 4.7 02/23/2021   CL 105 02/23/2021   CREATININE 0.7 02/23/2021   BUN 14 02/23/2021   CO2 24 (A) 02/23/2021   TSH 0.87 02/23/2021   HGBA1C 4.8 02/23/2021       Assessment & Plan:   Problem List Items Addressed This Visit     History of ovarian cyst    Pelvic ultrasound - as outlined.  Cyst present.  Had referred to gyn for evaluation. Need to confirm appt      Sleeping difficulty    Increase zoloft as outlined. Overall doing better.  Has hydroxyzine if needed.       Stress    Increased stress.  Doing better.  Feels better. On zoloft.  Will increase to 50mg  q day.  Follow.          , MD

## 2022-06-01 NOTE — Assessment & Plan Note (Addendum)
Pelvic ultrasound - as outlined.  Cyst present.  Had referred to gyn for evaluation. Need to confirm appt

## 2022-06-01 NOTE — Assessment & Plan Note (Signed)
Increased stress.  Doing better.  Feels better. On zoloft.  Will increase to 50mg  q day.  Follow.

## 2022-06-05 ENCOUNTER — Encounter: Payer: Self-pay | Admitting: Internal Medicine

## 2022-06-05 ENCOUNTER — Other Ambulatory Visit: Payer: Self-pay | Admitting: Internal Medicine

## 2022-06-05 ENCOUNTER — Telehealth: Payer: Self-pay | Admitting: Internal Medicine

## 2022-06-05 DIAGNOSIS — Z8742 Personal history of other diseases of the female genital tract: Secondary | ICD-10-CM

## 2022-06-05 NOTE — Telephone Encounter (Signed)
Order placed for pelvic ultrasound.

## 2022-06-05 NOTE — Telephone Encounter (Signed)
Previously had pelvic ultrasound that revealed an ovarian cyst.  I had referred her to gyn. It appears she never saw them. Please confirm if she did or did not.  If did not, need to refer.  If declines referral, then at least need to do a f/u pelvic ultrasound to reevaluated ovary (ovarian cyst).

## 2022-06-05 NOTE — Progress Notes (Signed)
Order placed for pelvic ultrasound.

## 2022-06-05 NOTE — Telephone Encounter (Signed)
S/w pt - does not want to see GYN - agreeable to US pelvic only just to check on cyst status

## 2022-06-05 NOTE — Assessment & Plan Note (Signed)
Increase zoloft as outlined. Overall doing better.  Has hydroxyzine if needed.

## 2022-06-12 ENCOUNTER — Other Ambulatory Visit: Payer: Self-pay

## 2022-09-19 ENCOUNTER — Encounter: Payer: Self-pay | Admitting: Internal Medicine

## 2022-09-19 ENCOUNTER — Ambulatory Visit (INDEPENDENT_AMBULATORY_CARE_PROVIDER_SITE_OTHER): Payer: Self-pay | Admitting: Internal Medicine

## 2022-09-19 VITALS — BP 120/88 | HR 82 | Temp 98.4°F | Resp 17 | Ht 69.0 in | Wt 150.6 lb

## 2022-09-19 DIAGNOSIS — F439 Reaction to severe stress, unspecified: Secondary | ICD-10-CM

## 2022-09-19 DIAGNOSIS — Z8041 Family history of malignant neoplasm of ovary: Secondary | ICD-10-CM

## 2022-09-19 DIAGNOSIS — Z713 Dietary counseling and surveillance: Secondary | ICD-10-CM

## 2022-09-19 DIAGNOSIS — Z1211 Encounter for screening for malignant neoplasm of colon: Secondary | ICD-10-CM

## 2022-09-19 MED ORDER — SERTRALINE HCL 25 MG PO TABS: 25.0000 mg | ORAL_TABLET | Freq: Every day | ORAL | 2 refills | Status: DC

## 2022-09-19 NOTE — Progress Notes (Signed)
[name], female   DOB: September 11, 1976, 46 y.o.   MRN: [id number]   Subjective:    [name], female    DOB: 16-Oct-1976, 46 y.o.   MRN: [id number]    Patient here for  Chief Complaint  Patient presents with   Follow-up    Weight loss   .   HPI Here as a work in to discuss weight loss. She is exercising. Watching her diet.  Not able to lose weight.  Is concerned.  Discussed weight management referral.  No chest pain or sob reported.  No increased cough or congestion.  No abdominal pain.  Has decreased carbs.  Increased protein. On zoloft.  Last visit, increased to 50mg  q day. Discussed decrease to 25mg  q day.  Follow up ovarian cyst.  Had referred her to gyn.  Declined appt. Continues to decline.  Will notify if changes her mind.    Past Medical History:  Diagnosis Date   History of abnormal cervical Pap smear    History of ovarian cyst    Migraine    Past Surgical History:  Procedure Laterality Date   AUGMENTATION MAMMAPLASTY Bilateral    BREAST BIOPSY Left 2003   TONSILLECTOMY     Family History  Problem Relation Age of Onset   Breast cancer Maternal Grandmother 52   Breast cancer Other 40   Breast cancer Other    Ovarian cancer Mother    Social History   Socioeconomic History   Marital status: Married    Spouse name: Not on file   Number of children: Not on file   Years of education: Not on file   Highest education level: Not on file  Occupational History   Not on file  Tobacco Use   Smoking status: Never   Smokeless tobacco: Never  Substance and Sexual Activity   Alcohol use: Yes   Drug use: Never   Sexual activity: Yes  Other Topics Concern   Not on file  Social History Narrative   Not on file   Social Determinants of Health   Financial Resource Strain: Not on file  Food Insecurity: Not on file  Transportation Needs: Not on file  Physical Activity: Not on file  Stress: Not on file  Social Connections: Not on file      Review of Systems  Constitutional:  Negative for appetite change and unexpected weight change.  HENT:  Negative for congestion and sinus pressure.   Respiratory:  Negative for cough, chest tightness and shortness of breath.   Cardiovascular:  Negative for chest pain, palpitations and leg swelling.  Gastrointestinal:  Negative for abdominal pain, diarrhea, nausea and vomiting.  Genitourinary:  Negative for difficulty urinating and dysuria.  Musculoskeletal:  Negative for joint swelling and myalgias.  Skin:  Negative for color change and rash.  Neurological:  Negative for dizziness and headaches.  Psychiatric/Behavioral:  Negative for agitation and dysphoric mood.        Objective:     BP 120/88 (BP Location: Left Arm, Patient Position: Sitting, Cuff Size: Small)   Pulse 82   Temp 98.4 F (36.9 C) (Temporal)   Resp 17   Ht 5\' 9"  (1.753 m)   Wt 150 lb 9.6 oz (68.3 kg)   SpO2 97%   BMI 22.24 kg/m  Wt Readings from Last 3 Encounters:  09/19/22 150 lb 9.6 oz (68.3 kg)  06/01/22 144 lb 12.8 oz (65.7 kg)  03/10/22 143 lb 9.6 oz (65.1 kg)  Physical Exam Vitals reviewed.  Constitutional:      General: She is not in acute distress.    Appearance: Normal appearance.  HENT:     Head: Normocephalic and atraumatic.     Right Ear: External ear normal.     Left Ear: External ear normal.  Eyes:     General: No scleral icterus.       Right eye: No discharge.        Left eye: No discharge.     Conjunctiva/sclera: Conjunctivae normal.  Neck:     Thyroid: No thyromegaly.  Cardiovascular:     Rate and Rhythm: Normal rate and regular rhythm.  Pulmonary:     Effort: No respiratory distress.     Breath sounds: Normal breath sounds. No wheezing.  Abdominal:     General: Bowel sounds are normal.     Palpations: Abdomen is soft.     Tenderness: There is no abdominal tenderness.  Musculoskeletal:        General: No swelling or tenderness.     Cervical back: Neck supple. No  tenderness.  Lymphadenopathy:     Cervical: No cervical adenopathy.  Skin:    Findings: No erythema or rash.  Neurological:     Mental Status: She is alert.  Psychiatric:        Mood and Affect: Mood normal.        Behavior: Behavior normal.      Outpatient Encounter Medications as of 09/19/2022  Medication Sig   hydrOXYzine (VISTARIL) 25 MG capsule Take 1 capsule (25 mg total) by mouth 2 (two) times daily as needed.   sertraline (ZOLOFT) 25 MG tablet Take 1 tablet (25 mg total) by mouth daily.   [DISCONTINUED] sertraline (ZOLOFT) 50 MG tablet Take 1 tablet (50 mg total) by mouth daily.   [DISCONTINUED] magnesium oxide (MAG-OX) 400 MG tablet Take 1 tablet (400 mg total) by mouth daily.   No facility-administered encounter medications on file as of 09/19/2022.     Lab Results  Component Value Date   WBC 6.5 02/23/2021   HGB 13.4 02/23/2021   HCT 41 02/23/2021   PLT 213 02/23/2021   CHOL 176 02/23/2021   TRIG 58 02/23/2021   HDL 78 (A) 02/23/2021   LDLCALC 85 02/23/2021   ALT 13 02/23/2021   AST 15 02/23/2021   NA 139 02/23/2021   K 4.7 02/23/2021   CL 105 02/23/2021   CREATININE 0.7 02/23/2021   BUN 14 02/23/2021   CO2 24 (A) 02/23/2021   TSH 0.87 02/23/2021   HGBA1C 4.8 02/23/2021       Assessment & Plan:   Problem List Items Addressed This Visit     Family history of ovarian cancer    Mother with ovarian cancer.  Pelvic ultrasound as outlined.  Discussed referral to gyn. Discussed checking CA 125.  Will notify me when agreeable.  Check cost of CA 125.       Stress    Increased stress.  Zoloft working well.  Would like to try zoloft 25mg  q day.  Follow.        Weight loss counseling, encounter for    She is concerned that she cannot lose weight.  She is exercising regularly.  Going to gym 5 days/week.  BMI 22. Watching diet.  Discussed diet and exercise.  Discussed weight loss management.        Other Visit Diagnoses     Screening for colon cancer    -  Primary   Relevant Orders   Ambulatory referral to Gastroenterology        Dale Patmos, MD

## 2022-09-24 ENCOUNTER — Encounter: Payer: Self-pay | Admitting: Internal Medicine

## 2022-09-24 DIAGNOSIS — Z713 Dietary counseling and surveillance: Secondary | ICD-10-CM | POA: Insufficient documentation

## 2022-09-24 NOTE — Assessment & Plan Note (Signed)
Increased stress.  Zoloft working well.  Would like to try zoloft 25mg  q day.  Follow.

## 2022-09-24 NOTE — Assessment & Plan Note (Signed)
Mother with ovarian cancer.  Pelvic ultrasound as outlined.  Discussed referral to gyn. Discussed checking CA 125.  Will notify me when agreeable.  Check cost of CA 125.

## 2022-09-24 NOTE — Assessment & Plan Note (Signed)
She is concerned that she cannot lose weight.  She is exercising regularly.  Going to gym 5 days/week.  BMI 22. Watching diet.  Discussed diet and exercise.  Discussed weight loss management.

## 2022-10-17 IMAGING — US US PELVIS COMPLETE WITH TRANSVAGINAL
3 series · 13 of 25 positions shown · non-contrast
Comparison: None

CLINICAL DATA: Pelvic pain, history of ovarian cyst, LMP 07/19/2021

EXAM:
TRANSABDOMINAL AND TRANSVAGINAL ULTRASOUND OF PELVIS
TECHNIQUE: Both transabdominal and transvaginal ultrasound examinations of the
pelvis were performed. Transabdominal technique was performed for
global imaging of the pelvis including uterus, ovaries, adnexal
regions, and pelvic cul-de-sac. It was necessary to proceed with
endovaginal exam following the transabdominal exam to visualize the
RIGHT ovary, lower uterus and cervix.

[Series 1: us pelvic complete with transvaginal · 7 of 140 slices shown]
[im 1/140]
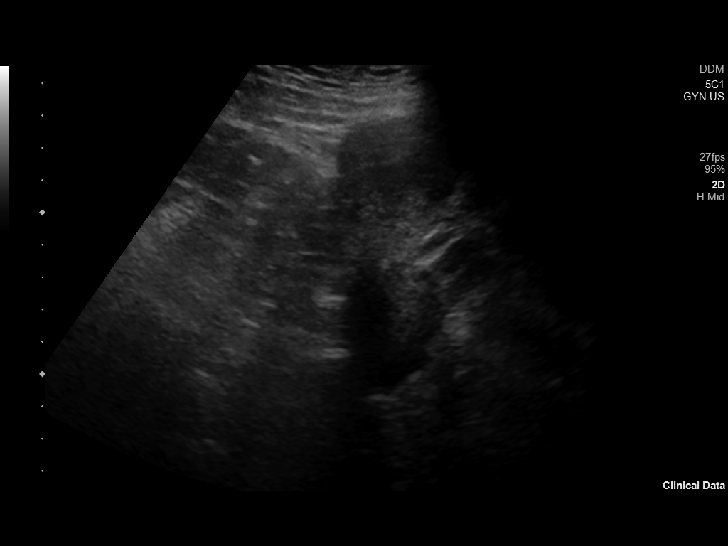
[im 22/140]
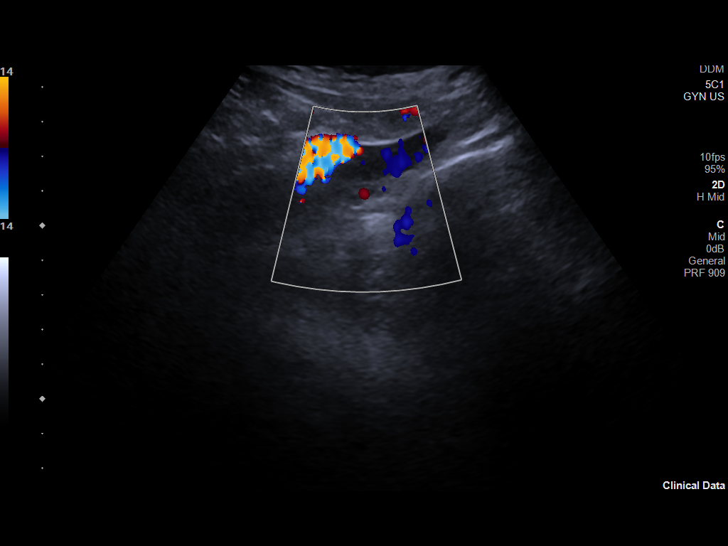
[im 43/140]
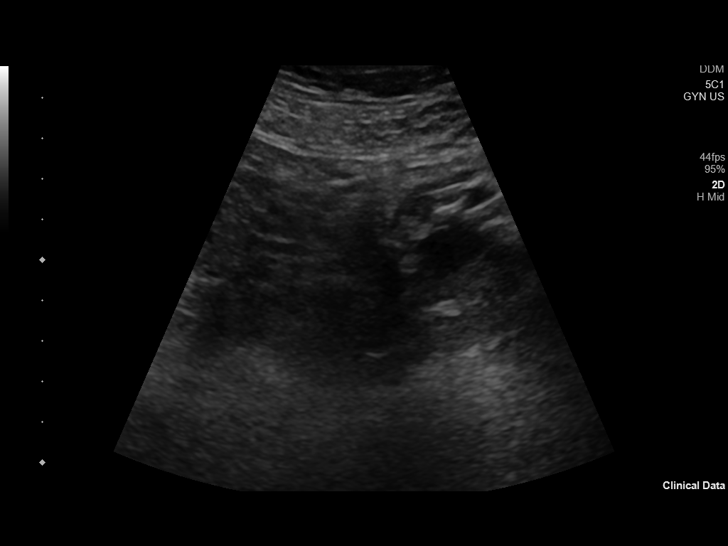
[im 65/140]
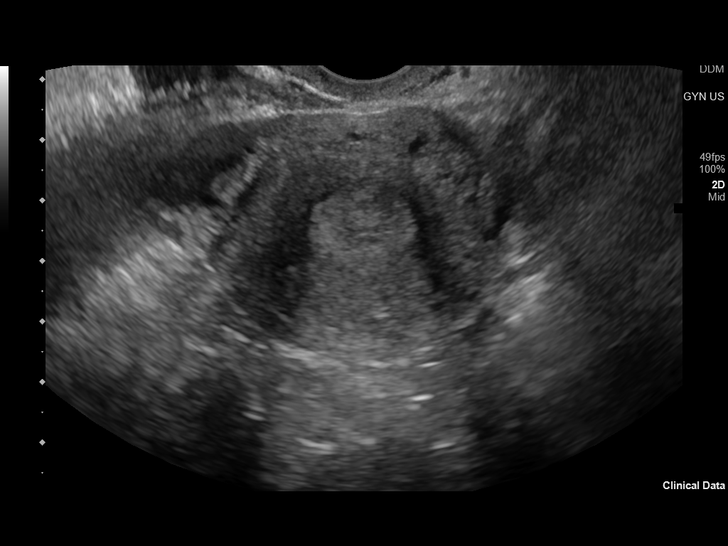
[im 86/140]
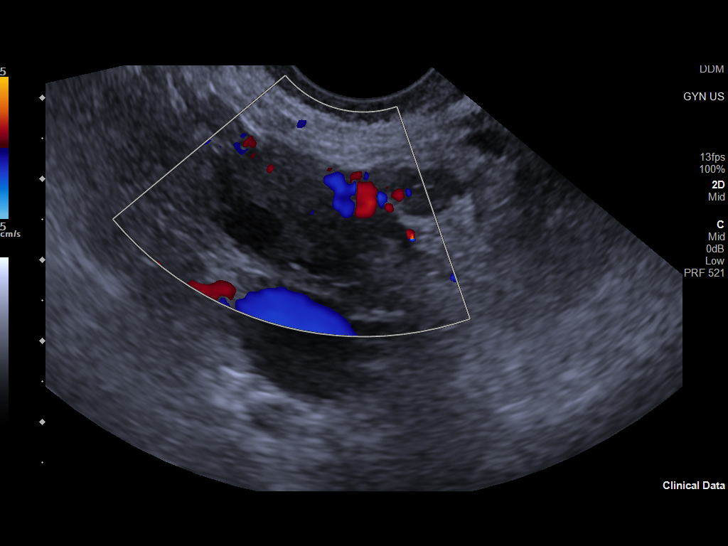
[im 107/140]
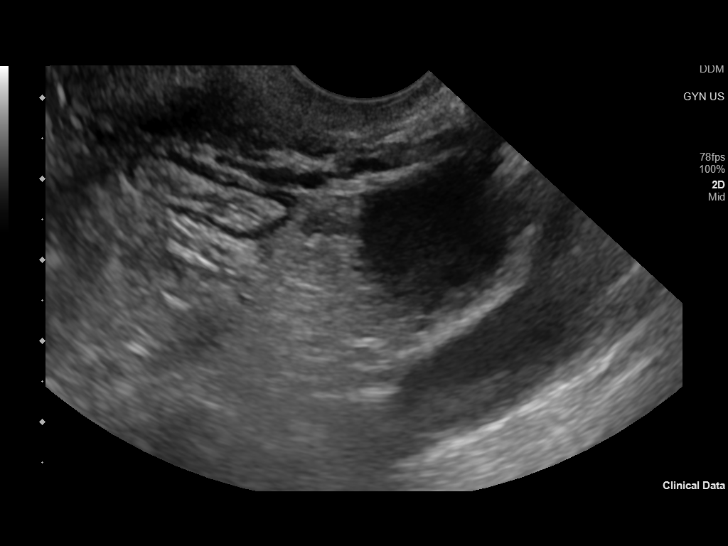
[im 129/140]
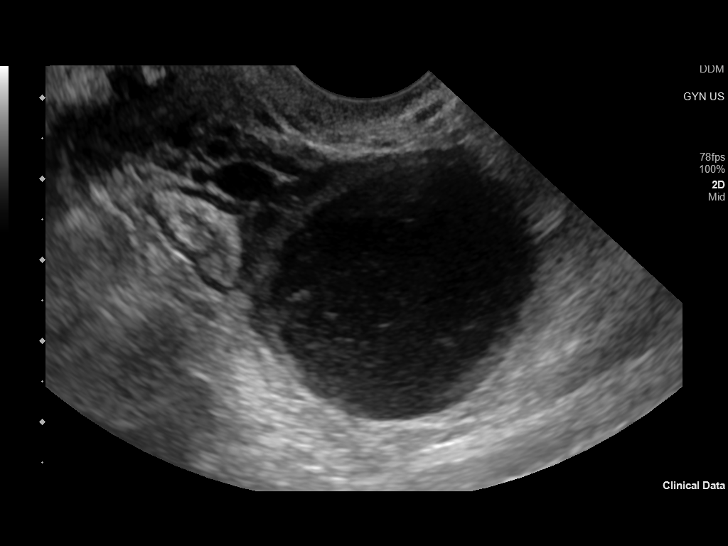

[Series 2: gyn us · 5 of 92 slices shown (1 of 2)]
[im 1/92]
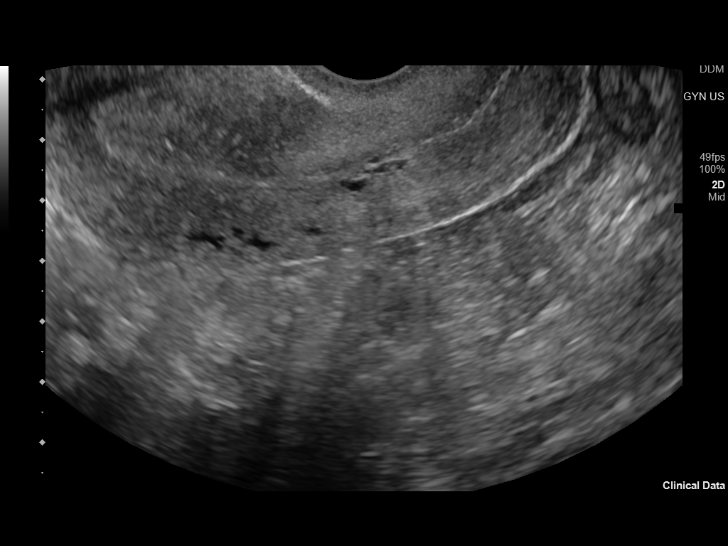
[im 21/92]
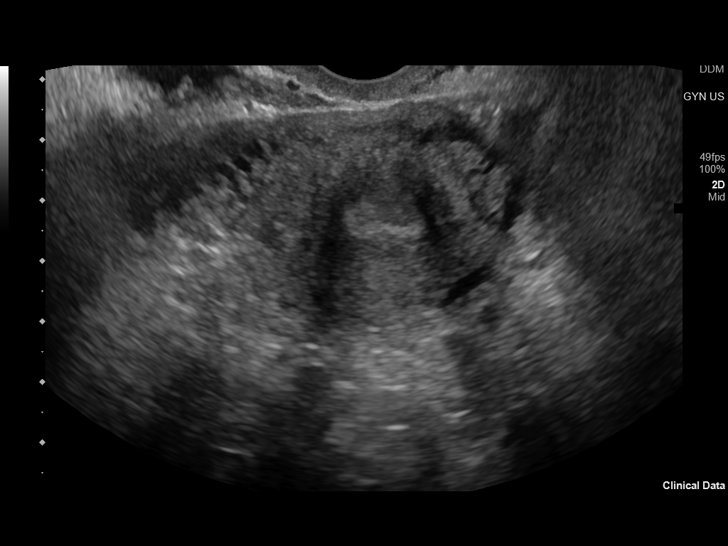
[im 41/92]
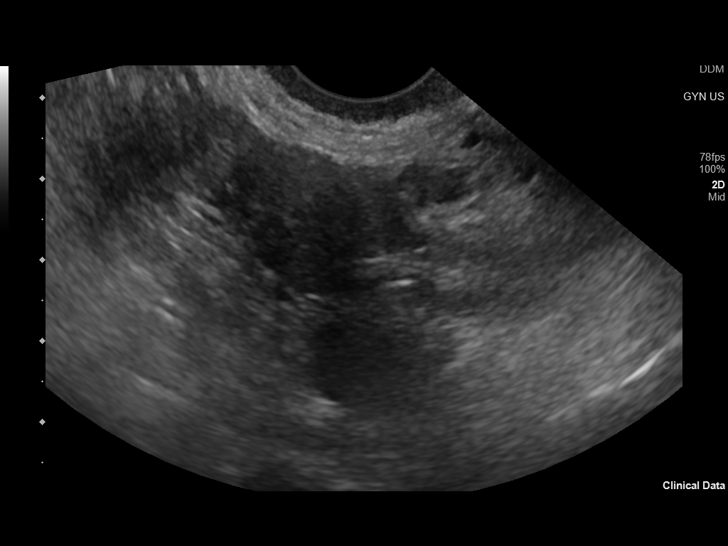
[im 61/92]
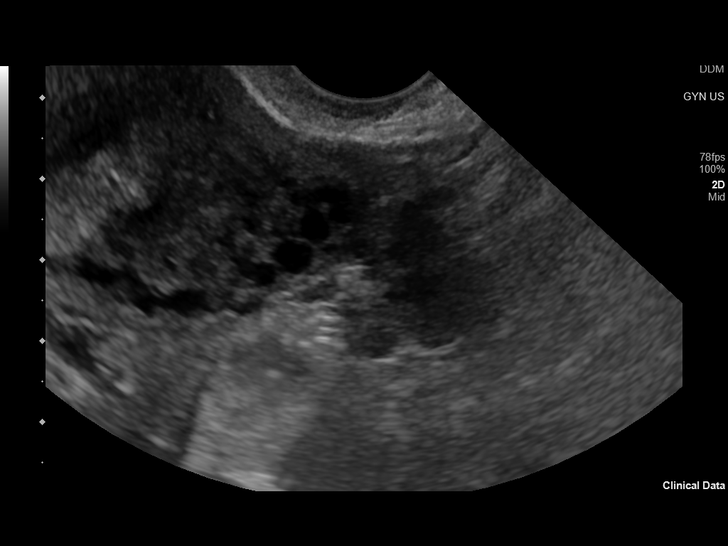
[im 81/92]
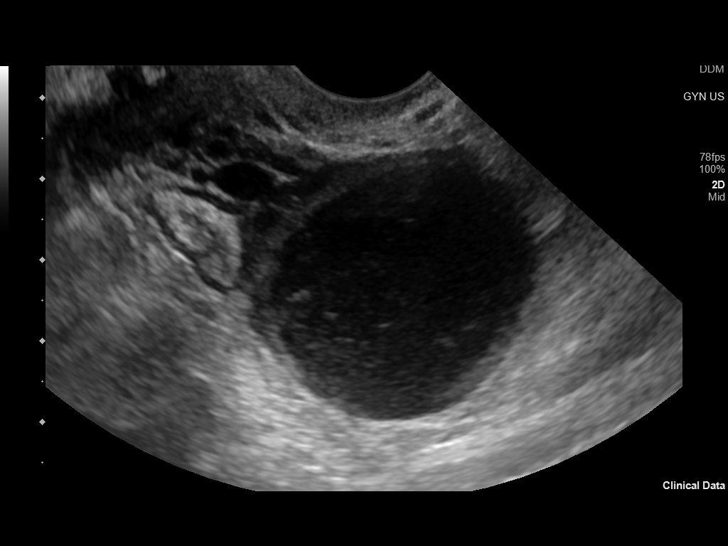

[Series 1001: gyn us · 1 of 1 slices shown (2 of 2)]
[im 1/1]
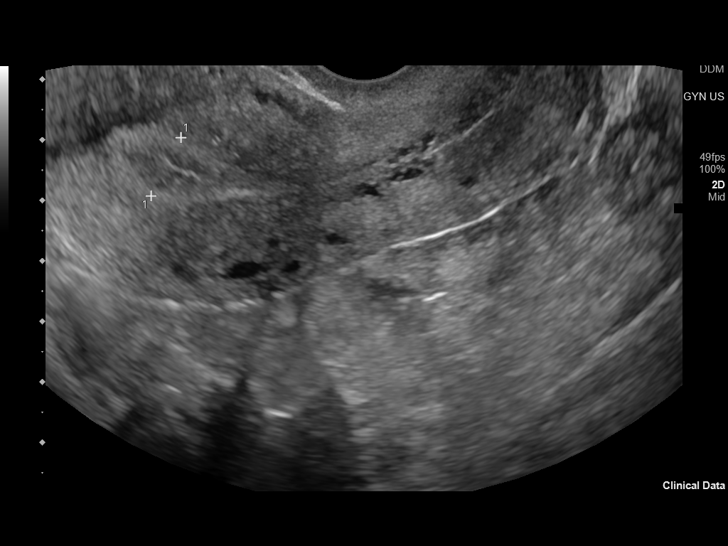

[13 of 25 positions shown; findings below may reference images not displayed]

FINDINGS: Uterus

Measurements: 9.1 x 4.4 x 4.8 cm = volume: 102 mL. Anteverted.
Normal morphology without mass

Endometrium

Thickness: 11 mm.  No endometrial fluid or focal abnormality

Right ovary

Measurements: 2.6 x 1.6 x 1.8 cm = volume: 3.9 mL. Normal morphology
without mass

Left ovary

Measurements: 4.1 x 3.4 x 3.6 cm = volume: 26 mL. Complicated cyst
within LEFT ovary 3.9 x 2.9 x 3.0 cm, containing scattered low level
internal echoes, question hemorrhagic cyst, less likely
endometrioma.

Other findings

No free pelvic fluid.  No adnexal masses.
IMPRESSION: Unremarkable uterus, endometrial complex and RIGHT ovary.

Complicated cyst LEFT ovary 3.9 cm diameter, favor hemorrhagic cyst
as above; follow-up ultrasound recommended in 6-12 weeks to
reassess.

## 2022-11-22 ENCOUNTER — Telehealth: Payer: Self-pay | Admitting: Internal Medicine

## 2022-11-22 DIAGNOSIS — N83209 Unspecified ovarian cyst, unspecified side: Secondary | ICD-10-CM

## 2022-11-22 NOTE — Telephone Encounter (Signed)
Called and left Sheri Leonard a message.  Wanted to touch base and see if she had found out any information regarding cost of weight management program, etc.  Also, wanted to let her know that the blood test that we had discussed (for ovary marker) is approximately 220$.  Let me know if she wants me to place referral to weight management or order CA 125.  Let me know if any questions or problems.

## 2022-11-27 NOTE — Telephone Encounter (Signed)
Lm for pt to cb.

## 2022-11-29 ENCOUNTER — Other Ambulatory Visit: Payer: Self-pay

## 2022-11-29 DIAGNOSIS — Z8041 Family history of malignant neoplasm of ovary: Secondary | ICD-10-CM

## 2022-11-29 NOTE — Addendum Note (Signed)
Addended by: Alisa Graff on: 11/29/2022 08:22 PM   Modules accepted: Orders

## 2022-11-29 NOTE — Telephone Encounter (Signed)
S/w pt - pt no longer wishes to follow up at weight management center. Declined referral at this time.   Is agreeable to CA125 - advised of cost Scheduled for 1/25 for lab draw Order placed

## 2022-12-07 ENCOUNTER — Other Ambulatory Visit (INDEPENDENT_AMBULATORY_CARE_PROVIDER_SITE_OTHER): Payer: Self-pay

## 2022-12-07 DIAGNOSIS — N83209 Unspecified ovarian cyst, unspecified side: Secondary | ICD-10-CM

## 2022-12-08 LAB — CA 125: CA 125: 16 U/mL (ref <35)

## 2023-04-27 ENCOUNTER — Encounter: Payer: Self-pay | Admitting: Internal Medicine

## 2023-04-27 DIAGNOSIS — Z8742 Personal history of other diseases of the female genital tract: Secondary | ICD-10-CM

## 2023-04-27 DIAGNOSIS — Z8041 Family history of malignant neoplasm of ovary: Secondary | ICD-10-CM

## 2023-04-30 NOTE — Telephone Encounter (Signed)
Please place order for referral to Dr Dalbert Garnet

## 2023-05-01 NOTE — Addendum Note (Signed)
Addended by: Rita Ohara D on: 05/01/2023 09:32 AM   Modules accepted: Orders

## 2023-05-01 NOTE — Telephone Encounter (Signed)
Referral placed to Dr Dalbert Garnet for family hx of ovarian cyst. Per patient having ultrasound done 6/24

## 2023-05-07 ENCOUNTER — Ambulatory Visit
Admission: RE | Admit: 2023-05-07 | Discharge: 2023-05-07 | Disposition: A | Discharge status: Home / Self Care | Payer: Self-pay | Source: Ambulatory Visit | Attending: Internal Medicine | Admitting: Internal Medicine

## 2023-05-07 DIAGNOSIS — Z8742 Personal history of other diseases of the female genital tract: Secondary | ICD-10-CM | POA: Insufficient documentation

## 2023-06-19 ENCOUNTER — Encounter: Payer: Self-pay | Admitting: Internal Medicine

## 2023-06-19 DIAGNOSIS — M25511 Pain in right shoulder: Secondary | ICD-10-CM | POA: Insufficient documentation

## 2024-08-13 ENCOUNTER — Ambulatory Visit (INDEPENDENT_AMBULATORY_CARE_PROVIDER_SITE_OTHER): Payer: Self-pay | Admitting: Internal Medicine

## 2024-08-13 ENCOUNTER — Encounter: Payer: Self-pay | Admitting: Internal Medicine

## 2024-08-13 ENCOUNTER — Telehealth: Payer: Self-pay

## 2024-08-13 VITALS — BP 110/68 | HR 80 | Resp 16 | Ht 68.0 in | Wt 145.0 lb

## 2024-08-13 DIAGNOSIS — Z8742 Personal history of other diseases of the female genital tract: Secondary | ICD-10-CM

## 2024-08-13 DIAGNOSIS — R5383 Other fatigue: Secondary | ICD-10-CM

## 2024-08-13 DIAGNOSIS — Z1211 Encounter for screening for malignant neoplasm of colon: Secondary | ICD-10-CM

## 2024-08-13 DIAGNOSIS — M25511 Pain in right shoulder: Secondary | ICD-10-CM

## 2024-08-13 DIAGNOSIS — F439 Reaction to severe stress, unspecified: Secondary | ICD-10-CM

## 2024-08-13 DIAGNOSIS — N920 Excessive and frequent menstruation with regular cycle: Secondary | ICD-10-CM

## 2024-08-13 NOTE — Telephone Encounter (Signed)
 Copied from CRM #[id number]. Topic: Clinical - Request for Lab/Test Order >> Aug 13, 2024  2:20 PM Rosina BIRCH wrote: Reason for CRM: patient called stating the provider gave her some lab orders but she can't identify one of the lab orders-[facility]. Patient want to order the right thing  [phone]

## 2024-08-13 NOTE — Progress Notes (Signed)
 Subjective:    [name], female    DOB: 03/09/1976, 48 y.o.   MRN: [id number]  Patient here for  Chief Complaint  Patient presents with   Medical Management of Chronic Issues    HPI Here as work in appt - work in to discuss increased fatigue, irregular periods and joint pain. She was previously seeing a functional medicine physician. States had testing. Was placed on testosterone supplementation. Off now. Not feeling well. Has had issues with her right shoulder. Had previous MRI and xray. Has been to PT and chiropractor. She does have some increased use of her arm now, but is still limited. Reports mood swings. Relates to her periods. Gets how when sleeping. Wakes up at night. Periods q 21-36 days. Recently - increased bleeding - q 14 days. Since 02/2024 - increased bleeding. Intermittent left lower quadrant pain.  Had f//u pelvic ultrasound 05/07/23 - interval resolution of previously seen ovarian cyst. Intermittent pain since. She is doing pilates. Walking over 2 miles per day. Reports weight gain. Fatigue. Interested in lab testing. Also, discussed testosterone replacement.    Past Medical History:  Diagnosis Date   History of abnormal cervical Pap smear    History of ovarian cyst    Migraine    Past Surgical History:  Procedure Laterality Date   AUGMENTATION MAMMAPLASTY Bilateral    BREAST BIOPSY Left 2003   TONSILLECTOMY     Family History  Problem Relation Age of Onset   Breast cancer Maternal Grandmother 4   Breast cancer Other 30   Breast cancer Other    Ovarian cancer Mother    Social History   Socioeconomic History   Marital status: Married    Spouse name: Not on file   Number of children: Not on file   Years of education: Not on file   Highest education level: Not on file  Occupational History   Not on file  Tobacco Use   Smoking status: Never   Smokeless tobacco: Never  Substance and Sexual Activity   Alcohol use: Yes   Drug use: Never    Sexual activity: Yes  Other Topics Concern   Not on file  Social History Narrative   Not on file   Social Drivers of Health   Financial Resource Strain: Not on file  Food Insecurity: Not on file  Transportation Needs: Not on file  Physical Activity: Not on file  Stress: Not on file  Social Connections: Not on file     Review of Systems  Constitutional:  Positive for fatigue. Negative for fever.  HENT:  Negative for congestion and sinus pressure.   Respiratory:  Negative for cough, chest tightness and shortness of breath.   Cardiovascular:  Negative for chest pain, palpitations and leg swelling.  Gastrointestinal:  Negative for diarrhea, nausea and vomiting.       Intermittent left lower quadrant pain.   Genitourinary:  Negative for difficulty urinating and dysuria.  Musculoskeletal:        Shoulder issues as outlined. Joint pain.   Skin:  Negative for color change and rash.  Neurological:  Negative for dizziness and headaches.  Psychiatric/Behavioral:         Increased stress related to current symptoms.        Objective:     BP 110/68   Pulse 80   Resp 16   Ht 5' 8 (1.727 m)   Wt 145 lb (65.8 kg)   SpO2 99%   BMI  22.05 kg/m  Wt Readings from Last 3 Encounters:  08/13/24 145 lb (65.8 kg)  09/19/22 150 lb 9.6 oz (68.3 kg)  06/01/22 144 lb 12.8 oz (65.7 kg)    Physical Exam Vitals reviewed.  Constitutional:      General: She is not in acute distress.    Appearance: Normal appearance.  HENT:     Head: Normocephalic and atraumatic.     Right Ear: External ear normal.     Left Ear: External ear normal.     Mouth/Throat:     Pharynx: No oropharyngeal exudate or posterior oropharyngeal erythema.  Eyes:     General: No scleral icterus.       Right eye: No discharge.        Left eye: No discharge.     Conjunctiva/sclera: Conjunctivae normal.  Neck:     Thyroid: No thyromegaly.  Cardiovascular:     Rate and Rhythm: Normal rate and regular rhythm.   Pulmonary:     Effort: No respiratory distress.     Breath sounds: Normal breath sounds. No wheezing.  Abdominal:     General: Bowel sounds are normal.     Palpations: Abdomen is soft.     Tenderness: There is no abdominal tenderness.  Musculoskeletal:        General: No swelling or tenderness.     Cervical back: Neck supple. No tenderness.  Lymphadenopathy:     Cervical: No cervical adenopathy.  Skin:    Findings: No erythema or rash.  Neurological:     Mental Status: She is alert.  Psychiatric:        Mood and Affect: Mood normal.        Behavior: Behavior normal.         Outpatient Encounter Medications as of 08/13/2024  Medication Sig   hydrOXYzine  (VISTARIL ) 25 MG capsule Take 1 capsule (25 mg total) by mouth 2 (two) times daily as needed.   [DISCONTINUED] sertraline  (ZOLOFT ) 25 MG tablet Take 1 tablet (25 mg total) by mouth daily.   No facility-administered encounter medications on file as of 08/13/2024.     Lab Results  Component Value Date   WBC 6.5 02/23/2021   HGB 13.4 02/23/2021   HCT 41 02/23/2021   PLT 213 02/23/2021   CHOL 176 02/23/2021   TRIG 58 02/23/2021   HDL 78 (A) 02/23/2021   LDLCALC 85 02/23/2021   ALT 13 02/23/2021   AST 15 02/23/2021   NA 139 02/23/2021   K 4.7 02/23/2021   CL 105 02/23/2021   CREATININE 0.7 02/23/2021   BUN 14 02/23/2021   CO2 24 (A) 02/23/2021   TSH 0.87 02/23/2021   HGBA1C 4.8 02/23/2021    US  Pelvic Complete With Transvaginal Result Date: 05/08/2023 CLINICAL DATA:  Left ovarian cyst seen on prior ultrasound. The patient's last menstrual period was 04/17/2023. EXAM: TRANSABDOMINAL AND TRANSVAGINAL ULTRASOUND OF PELVIS TECHNIQUE: Both transabdominal and transvaginal ultrasound examinations of the pelvis were performed. Transabdominal technique was performed for global imaging of the pelvis including uterus, ovaries, adnexal regions, and pelvic cul-de-sac. It was necessary to proceed with endovaginal exam following the  transabdominal exam to visualize the ovaries. COMPARISON:  Pelvic ultrasound dated 08/03/2021. FINDINGS: Uterus Measurements: 8.1 x 4.3 x 5.2 cm = volume: 94 mL. No fibroids or other mass visualized. Endometrium Thickness: 8 mm.  No focal abnormality visualized. Right ovary Measurements: 3.7 x 2.5 x 2.9 cm = volume: 14 mL. A right ovarian cyst measures 2.6 x 2.0 x  2.8 cm. No imaging follow-up is recommended for this finding. Left ovary Measurements: 3.1 x 1.7 x 2.4 cm = volume: 7 mL. The previously described complicated cyst in the left ovary has since resolved. Normal appearance/no adnexal mass. Other findings No abnormal free fluid. IMPRESSION: Interval resolution of the previously seen cyst in the left ovary. No suspicious findings. Electronically Signed   By: Norman Hopper M.D.   On: 05/08/2023 14:58       Assessment & Plan:  Stress Assessment & Plan: Increased stress - related to current symptoms and issues as outlined. Off zoloft . Discussed. Hold on medication or further intervention. Labs recommended.    Right shoulder pain, unspecified chronicity Assessment & Plan: Has seen ortho as outlined. S/p xray and MRI. S/p PT and chiropractor. Has increased rom. Still with limitation. Desires no further intervention. Follow.    Colon cancer screening Assessment & Plan: Colonoscopy due - screening starting 43.     Menorrhagia with regular cycle Assessment & Plan: Periods as outlined. Discussed. She was concerned regarding menopause and hormone changes contributing to current symptoms. Discussed. Periods have actually increased, instead of decreasing. Discussed further w/up - pelvic ultrasound and gyn f/u. Notify me if agreeable.    History of ovarian cyst Assessment & Plan: Had f/u pelvic ultrasound 04/2023 - no cyst. Follow.    Other fatigue Assessment & Plan: Check routine labs, including cbc, met c and tsh. Also with increased joint pain, check esr and crp. She wants to get labs drawn  at outside lab. Await results. Follow.       Allena Hamilton, MD

## 2024-08-13 NOTE — Patient Instructions (Signed)
 CRP ESR 25 hydroxyvitamin D CBC MetC TSH Fasting Lipid panel.  Iron studies B12

## 2024-08-14 NOTE — Telephone Encounter (Signed)
Viewed by patient .

## 2024-08-17 ENCOUNTER — Encounter: Payer: Self-pay | Admitting: Internal Medicine

## 2024-08-17 DIAGNOSIS — R5383 Other fatigue: Secondary | ICD-10-CM | POA: Insufficient documentation

## 2024-08-17 NOTE — Assessment & Plan Note (Signed)
Colonoscopy due - screening starting 45.

## 2024-08-17 NOTE — Assessment & Plan Note (Signed)
 Increased stress - related to current symptoms and issues as outlined. Off zoloft . Discussed. Hold on medication or further intervention. Labs recommended.

## 2024-08-17 NOTE — Assessment & Plan Note (Signed)
 Had f/u pelvic ultrasound 04/2023 - no cyst. Follow.

## 2024-08-17 NOTE — Assessment & Plan Note (Signed)
 Periods as outlined. Discussed. She was concerned regarding menopause and hormone changes contributing to current symptoms. Discussed. Periods have actually increased, instead of decreasing. Discussed further w/up - pelvic ultrasound and gyn f/u. Notify me if agreeable.

## 2024-08-17 NOTE — Assessment & Plan Note (Signed)
 Check routine labs, including cbc, met c and tsh. Also with increased joint pain, check esr and crp. She wants to get labs drawn at outside lab. Await results. Follow.

## 2024-08-17 NOTE — Assessment & Plan Note (Signed)
 Has seen ortho as outlined. S/p xray and MRI. S/p PT and chiropractor. Has increased rom. Still with limitation. Desires no further intervention. Follow.

## 2024-08-20 LAB — IRON,TIBC AND FERRITIN PANEL
%SAT: 42
Ferritin: 16
Iron: 132
TIBC: 318

## 2024-08-20 LAB — POCT ERYTHROCYTE SEDIMENTATION RATE, NON-AUTOMATED: Sed Rate: 2

## 2024-08-20 LAB — CBC: RBC: 4.26 (ref 3.87–5.11)

## 2024-08-20 LAB — LIPID PANEL
Cholesterol: 172 (ref 0–200)
HDL: 73 — AB (ref 35–70)
LDL Cholesterol: 83
LDl/HDL Ratio: 1.1
Triglycerides: 77 (ref 40–160)

## 2024-08-20 LAB — CBC AND DIFFERENTIAL
HCT: 41 (ref 36–46)
Hemoglobin: 13.5 (ref 12.0–16.0)
Neutrophils Absolute: 4610
Platelets: 198 K/uL (ref 150–400)
WBC: 6.8

## 2024-08-20 LAB — TSH: TSH: 1.07 (ref 0.41–5.90)

## 2024-08-20 LAB — VITAMIN D 25 HYDROXY (VIT D DEFICIENCY, FRACTURES): Vit D, 25-Hydroxy: 49

## 2024-08-27 NOTE — Telephone Encounter (Signed)
 I have printed labs to abstract but attached is a copy for you to review
